# Patient Record
Sex: Female | Born: 1955
Health system: Southern US, Community
[De-identification: ages and names within clinical notes are randomized; demographics above are authoritative.]

## PROBLEM LIST (undated history)

## (undated) DIAGNOSIS — N189 Chronic kidney disease, unspecified: Secondary | ICD-10-CM

## (undated) DIAGNOSIS — E785 Hyperlipidemia, unspecified: Secondary | ICD-10-CM

## (undated) DIAGNOSIS — J40 Bronchitis, not specified as acute or chronic: Secondary | ICD-10-CM

## (undated) DIAGNOSIS — C801 Malignant (primary) neoplasm, unspecified: Secondary | ICD-10-CM

## (undated) DIAGNOSIS — I517 Cardiomegaly: Secondary | ICD-10-CM

## (undated) DIAGNOSIS — I10 Essential (primary) hypertension: Secondary | ICD-10-CM

## (undated) DIAGNOSIS — I509 Heart failure, unspecified: Secondary | ICD-10-CM

## (undated) HISTORY — DX: Cardiomegaly: I51.7

## (undated) HISTORY — DX: Chronic kidney disease, unspecified: N18.9

## (undated) HISTORY — PX: HERNIA REPAIR: SHX51

## (undated) HISTORY — PX: TOTAL KNEE ARTHROPLASTY: SHX125

## (undated) HISTORY — PX: TUBAL LIGATION: SHX77

## (undated) HISTORY — DX: Malignant (primary) neoplasm, unspecified: C80.1

## (undated) HISTORY — PX: OTHER SURGICAL HISTORY: SHX169

## (undated) HISTORY — DX: Bronchitis, not specified as acute or chronic: J40

## (undated) HISTORY — DX: Heart failure, unspecified: I50.9

## (undated) HISTORY — DX: Essential (primary) hypertension: I10

## (undated) HISTORY — DX: Hyperlipidemia, unspecified: E78.5

---

## 2004-06-30 ENCOUNTER — Other Ambulatory Visit: Payer: Self-pay

## 2004-08-25 ENCOUNTER — Ambulatory Visit: Payer: Self-pay | Admitting: Internal Medicine

## 2004-09-07 ENCOUNTER — Other Ambulatory Visit: Payer: Self-pay

## 2004-09-14 ENCOUNTER — Ambulatory Visit: Payer: Self-pay | Admitting: Orthopaedic Surgery

## 2004-09-14 ENCOUNTER — Ambulatory Visit: Payer: Self-pay | Admitting: Internal Medicine

## 2004-10-14 ENCOUNTER — Ambulatory Visit: Payer: Self-pay | Admitting: Internal Medicine

## 2004-10-19 ENCOUNTER — Ambulatory Visit: Payer: Self-pay | Admitting: Unknown Physician Specialty

## 2004-11-14 ENCOUNTER — Ambulatory Visit: Payer: Self-pay | Admitting: Internal Medicine

## 2005-08-24 ENCOUNTER — Emergency Department: Payer: Self-pay | Admitting: Emergency Medicine

## 2005-08-24 ENCOUNTER — Other Ambulatory Visit: Payer: Self-pay

## 2006-02-23 ENCOUNTER — Ambulatory Visit: Payer: Self-pay | Admitting: Internal Medicine

## 2006-03-09 ENCOUNTER — Ambulatory Visit: Payer: Self-pay | Admitting: Unknown Physician Specialty

## 2006-03-15 ENCOUNTER — Emergency Department: Payer: Self-pay | Admitting: Emergency Medicine

## 2006-03-29 ENCOUNTER — Ambulatory Visit: Payer: Self-pay | Admitting: Unknown Physician Specialty

## 2006-05-09 ENCOUNTER — Ambulatory Visit: Payer: Self-pay | Admitting: Gastroenterology

## 2006-07-27 ENCOUNTER — Ambulatory Visit: Payer: Self-pay | Admitting: Pain Medicine

## 2006-08-02 ENCOUNTER — Ambulatory Visit: Payer: Self-pay | Admitting: Pain Medicine

## 2006-08-10 ENCOUNTER — Emergency Department: Payer: Self-pay | Admitting: Internal Medicine

## 2006-09-05 ENCOUNTER — Ambulatory Visit: Payer: Self-pay | Admitting: Pain Medicine

## 2006-11-02 ENCOUNTER — Ambulatory Visit: Payer: Self-pay | Admitting: Pain Medicine

## 2006-11-22 ENCOUNTER — Ambulatory Visit: Payer: Self-pay | Admitting: Pain Medicine

## 2007-05-01 ENCOUNTER — Other Ambulatory Visit: Payer: Self-pay

## 2007-05-01 ENCOUNTER — Ambulatory Visit: Payer: Self-pay | Admitting: Orthopaedic Surgery

## 2007-05-30 ENCOUNTER — Ambulatory Visit: Payer: Self-pay | Admitting: Unknown Physician Specialty

## 2007-06-18 ENCOUNTER — Ambulatory Visit: Payer: Self-pay | Admitting: Orthopaedic Surgery

## 2007-07-10 ENCOUNTER — Inpatient Hospital Stay: Payer: Self-pay | Admitting: Orthopaedic Surgery

## 2007-09-02 IMAGING — CT CT ABD-PELV W/ CM
1 of 2 series · 15 of 32 positions shown, 19 images · non-contrast
Comparison: none

REASON FOR EXAM: LLQ pain  and abd pain
COMMENTS:

[Series 2: abdomen · axial · 0.96mm/px · z∈[-324,+76]mm · 15 of 56 slices shown, 19 images]
[im 3/56  soft-tissue]
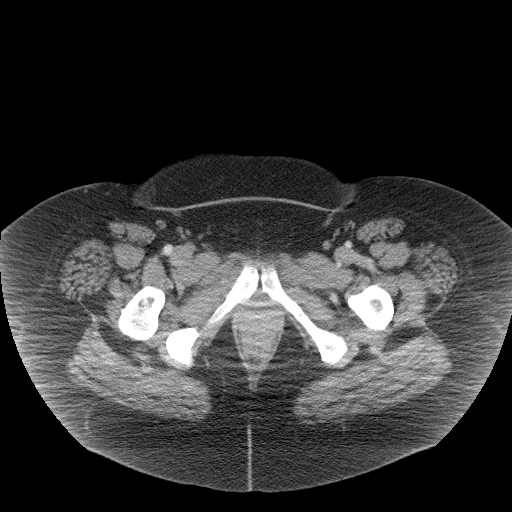
[im 3/56  bone]
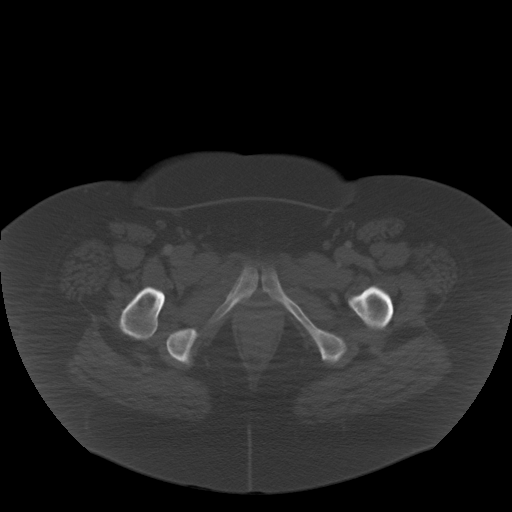
[im 7/56  soft-tissue]
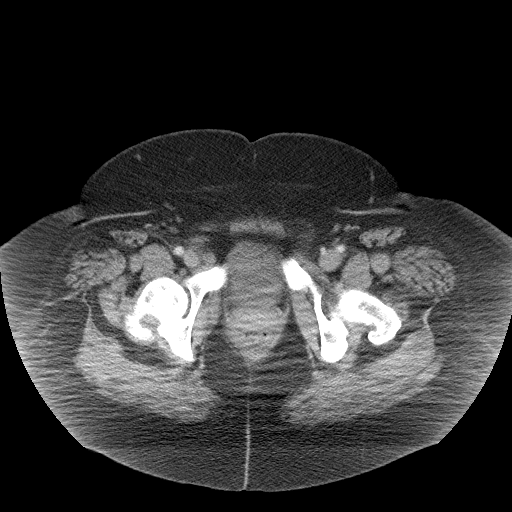
[im 12/56  soft-tissue]
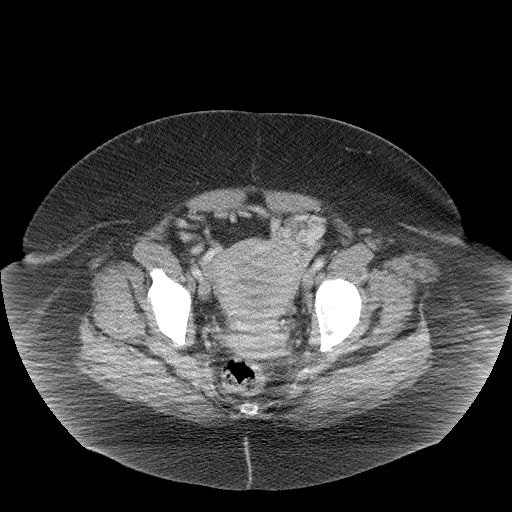
[im 17/56  soft-tissue]
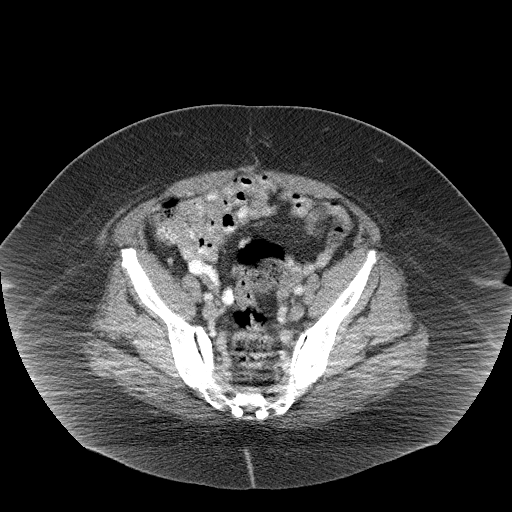
[im 19/56  soft-tissue]
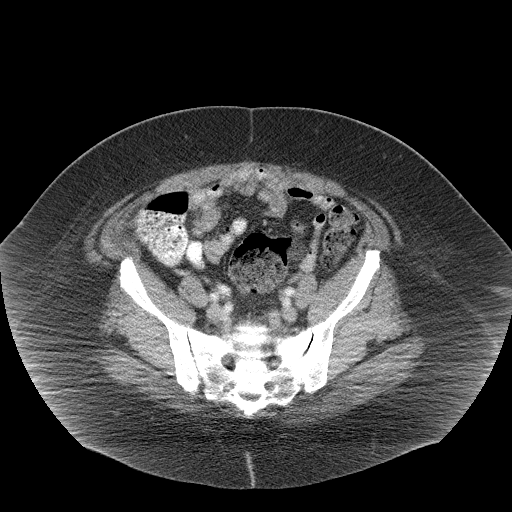
[im 23/56  soft-tissue]
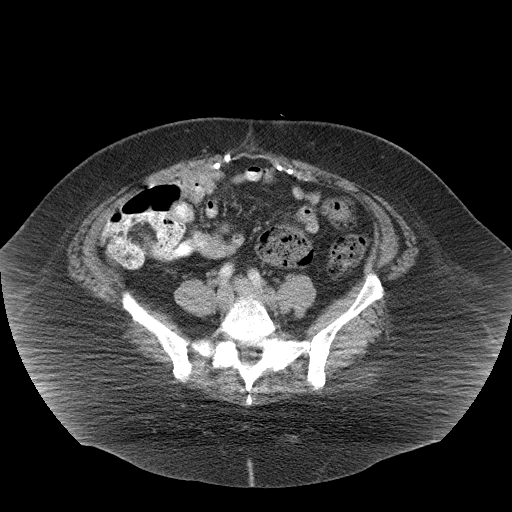
[im 28/56  soft-tissue]
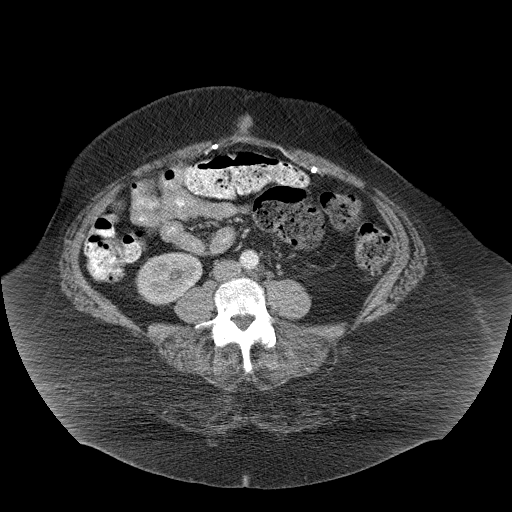
[im 33/56  soft-tissue]
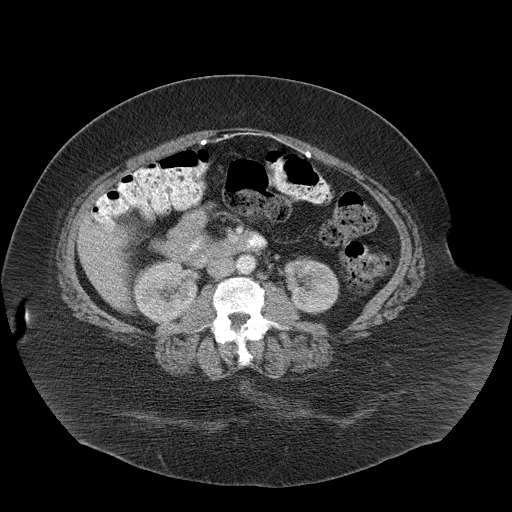
[im 37/56  soft-tissue]
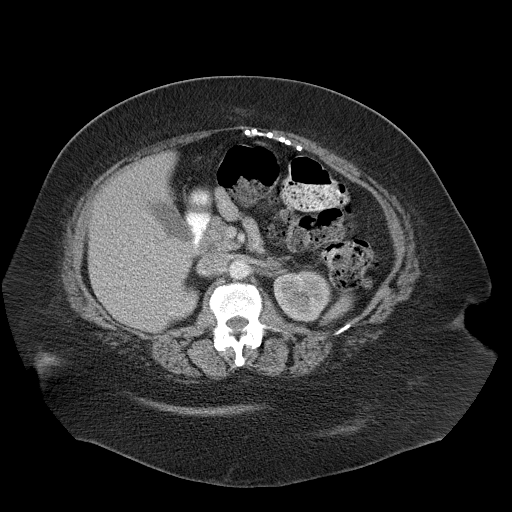
[im 37/56  bone]
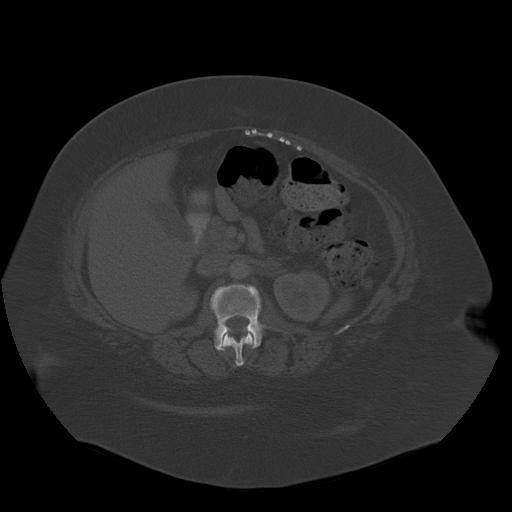
[im 39/56  soft-tissue]
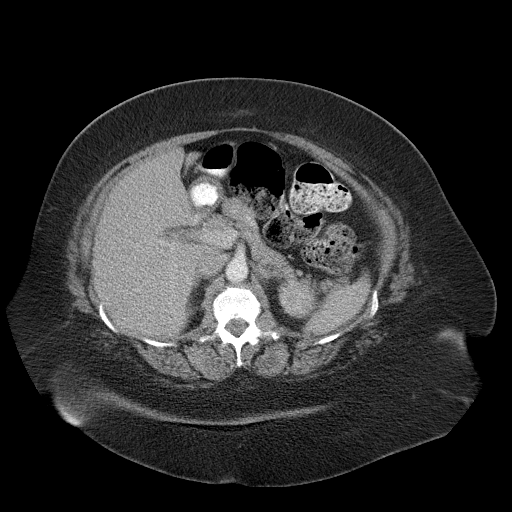
[im 44/56  soft-tissue]
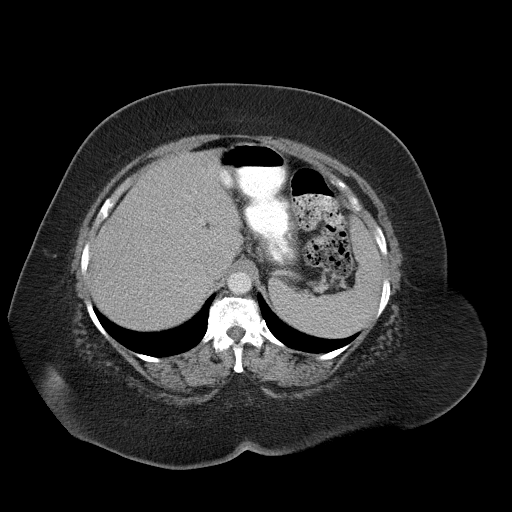
[im 46/56  lung]
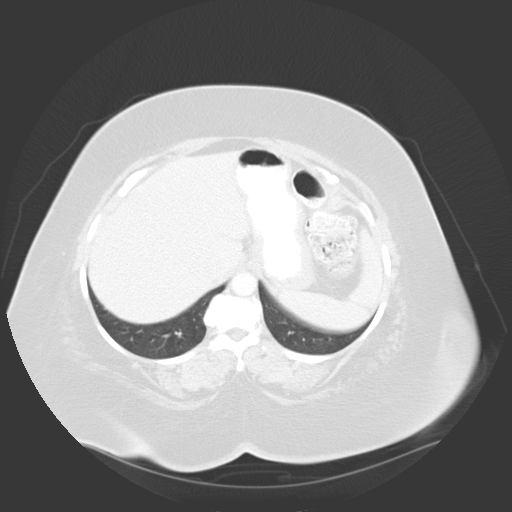
[im 49/56  soft-tissue]
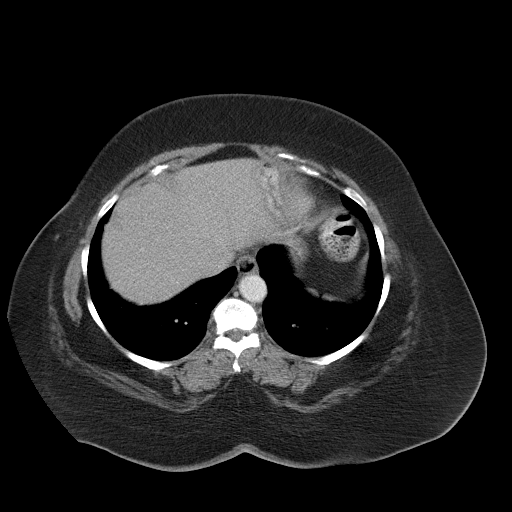
[im 49/56  lung]
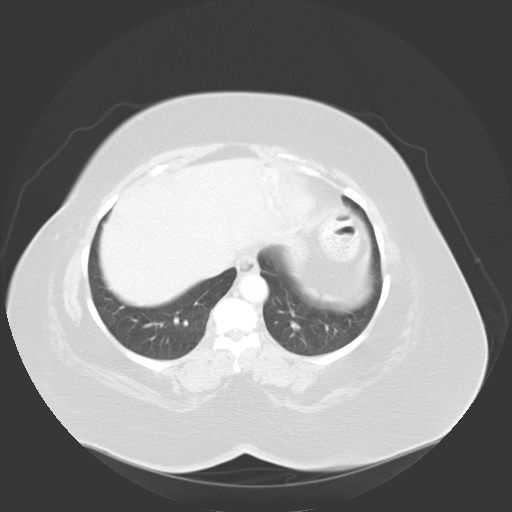
[im 51/56  lung]
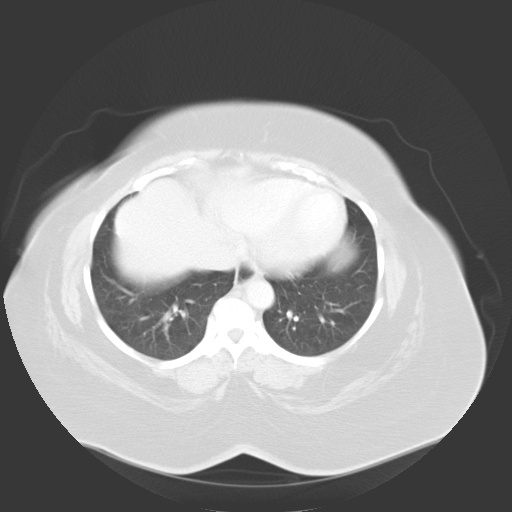
[im 53/56  soft-tissue]
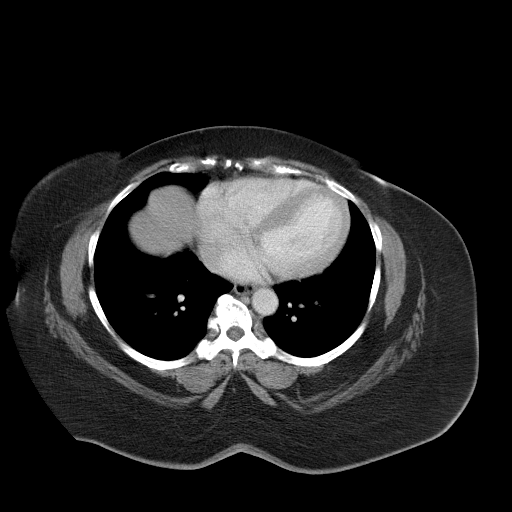
[im 53/56  lung]
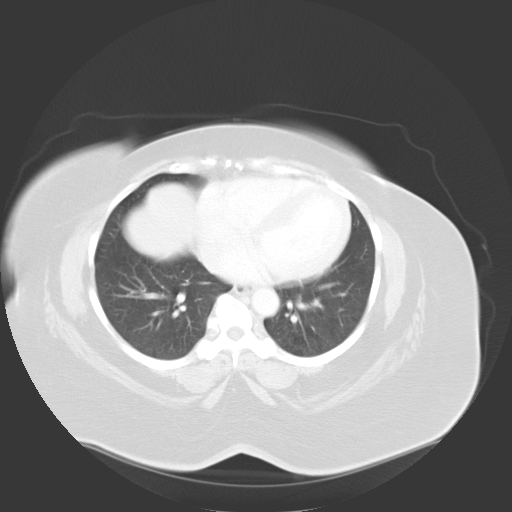

[15 of 32 positions shown; findings below may reference images not displayed]

PROCEDURE:     CT  - CT ABDOMEN / PELVIS  W  - March 09, 2006  [DATE]

RESULT:     IV and oral contrast enhanced CT scan of the abdomen and pelvis
is performed. The study shows a moderately large amount of stool scattered
through the colon down to the rectum.  There is some significant artifact
especially over the pelvic region because of the patient's body habitus.
There appears to be some air in the vaginal cuff anterior to the rectum.
There is significant artifact through the pelvic region. No definite
adenopathy is seen. The uterus is present in the midline in an anteverted
fashion.  No abnormal bowel distention is seen. There is no free air or
definite free fluid. Trace amounts of free fluid could not be completely
excluded given the artifact present.  No definite inflammatory stranding is
seen. There is an old ventral hernia repair.  The pancreas, liver, adrenal
glands and spleen appear to be normal. The gallbladder is present and no
radiopaque stones are evident. The aorta shows some minimal atherosclerotic
calcifications.
IMPRESSION: 1)No significant abnormality demonstrated.

## 2008-08-10 ENCOUNTER — Emergency Department: Payer: Self-pay | Admitting: Emergency Medicine

## 2008-10-23 ENCOUNTER — Ambulatory Visit: Payer: Self-pay | Admitting: Unknown Physician Specialty

## 2009-01-29 ENCOUNTER — Ambulatory Visit: Payer: Self-pay | Admitting: Unknown Physician Specialty

## 2009-07-28 ENCOUNTER — Ambulatory Visit: Payer: Self-pay | Admitting: Gastroenterology

## 2010-07-06 ENCOUNTER — Ambulatory Visit: Payer: Self-pay | Admitting: Unknown Physician Specialty

## 2013-01-09 ENCOUNTER — Emergency Department: Payer: Self-pay | Admitting: Emergency Medicine

## 2013-01-09 LAB — COMPREHENSIVE METABOLIC PANEL
Albumin: 3.3 g/dL — ABNORMAL LOW (ref 3.4–5.0)
Alkaline Phosphatase: 85 U/L (ref 50–136)
Anion Gap: 9 (ref 7–16)
BUN: 27 mg/dL — ABNORMAL HIGH (ref 7–18)
Bilirubin,Total: 0.2 mg/dL (ref 0.2–1.0)
Chloride: 104 mmol/L (ref 98–107)
Co2: 24 mmol/L (ref 21–32)
Creatinine: 1.25 mg/dL (ref 0.60–1.30)
EGFR (African American): 55 — ABNORMAL LOW
EGFR (Non-African Amer.): 47 — ABNORMAL LOW
Glucose: 104 mg/dL — ABNORMAL HIGH (ref 65–99)
Osmolality: 279 (ref 275–301)
Potassium: 3.9 mmol/L (ref 3.5–5.1)
SGOT(AST): 13 U/L — ABNORMAL LOW (ref 15–37)
Sodium: 137 mmol/L (ref 136–145)
Total Protein: 7.6 g/dL (ref 6.4–8.2)

## 2013-01-09 LAB — CK TOTAL AND CKMB (NOT AT ARMC): CK, Total: 99 U/L (ref 21–215)

## 2013-01-09 LAB — PRO B NATRIURETIC PEPTIDE: B-Type Natriuretic Peptide: 509 pg/mL — ABNORMAL HIGH (ref 0–125)

## 2013-01-09 LAB — CBC
MCH: 21.2 pg — ABNORMAL LOW (ref 26.0–34.0)
WBC: 6 10*3/uL (ref 3.6–11.0)

## 2013-01-09 LAB — TROPONIN I: Troponin-I: <0.02 ng/mL

## 2013-04-23 ENCOUNTER — Emergency Department: Payer: Self-pay | Admitting: Internal Medicine

## 2013-04-23 LAB — CBC
HGB: 10.7 g/dL — ABNORMAL LOW (ref 12.0–16.0)
MCH: 21.3 pg — ABNORMAL LOW (ref 26.0–34.0)
Platelet: 154 10*3/uL (ref 150–440)
RBC: 5.03 10*6/uL (ref 3.80–5.20)
RDW: 17.1 % — ABNORMAL HIGH (ref 11.5–14.5)
WBC: 5.5 10*3/uL (ref 3.6–11.0)

## 2013-04-23 LAB — CK TOTAL AND CKMB (NOT AT ARMC)
CK, Total: 105 U/L (ref 21–215)
CK-MB: 1.2 ng/mL (ref 0.5–3.6)

## 2013-04-23 LAB — COMPREHENSIVE METABOLIC PANEL
Alkaline Phosphatase: 92 U/L (ref 50–136)
Anion Gap: 5 — ABNORMAL LOW (ref 7–16)
Calcium, Total: 8.6 mg/dL (ref 8.5–10.1)
Co2: 28 mmol/L (ref 21–32)
Creatinine: 1 mg/dL (ref 0.60–1.30)
Potassium: 4.1 mmol/L (ref 3.5–5.1)
SGPT (ALT): 14 U/L (ref 12–78)
Sodium: 140 mmol/L (ref 136–145)
Total Protein: 7.5 g/dL (ref 6.4–8.2)

## 2013-04-23 LAB — PRO B NATRIURETIC PEPTIDE: B-Type Natriuretic Peptide: 2071 pg/mL — ABNORMAL HIGH (ref 0–125)

## 2013-04-23 LAB — TROPONIN I: Troponin-I: <0.02 ng/mL

## 2013-05-03 ENCOUNTER — Inpatient Hospital Stay: Payer: Self-pay | Admitting: Student

## 2013-05-03 LAB — CK TOTAL AND CKMB (NOT AT ARMC)
CK, Total: 122 U/L (ref 21–215)
CK, Total: 106 U/L (ref 21–215)
CK, Total: 117 U/L (ref 21–215)
CK-MB: 0.9 ng/mL (ref 0.5–3.6)
CK-MB: 0.7 ng/mL (ref 0.5–3.6)
CK-MB: 0.9 ng/mL (ref 0.5–3.6)

## 2013-05-03 LAB — TROPONIN I
Troponin-I: <0.02 ng/mL
Troponin-I: <0.02 ng/mL

## 2013-05-03 LAB — CBC
HCT: 36.4 % (ref 35.0–47.0)
HGB: 11.6 g/dL — ABNORMAL LOW (ref 12.0–16.0)
MCHC: 32 g/dL (ref 32.0–36.0)
MCV: 69 fL — ABNORMAL LOW (ref 80–100)
Platelet: 193 10*3/uL (ref 150–440)
RDW: 16.7 % — ABNORMAL HIGH (ref 11.5–14.5)
WBC: 6.3 10*3/uL (ref 3.6–11.0)

## 2013-05-03 LAB — URINALYSIS, COMPLETE
Bilirubin,UR: NEGATIVE
Blood: NEGATIVE
Ketone: NEGATIVE
Nitrite: NEGATIVE
Ph: 6 (ref 4.5–8.0)
Protein: NEGATIVE
Specific Gravity: 1.014 (ref 1.003–1.030)
WBC UR: 2 /HPF (ref 0–5)

## 2013-05-03 LAB — SODIUM, URINE, RANDOM: Sodium, Urine Random: 102 mmol/L (ref 20–110)

## 2013-05-03 LAB — BASIC METABOLIC PANEL
Chloride: 107 mmol/L (ref 98–107)
EGFR (Non-African Amer.): 21 — ABNORMAL LOW
Osmolality: 289 (ref 275–301)
Potassium: 3.2 mmol/L — ABNORMAL LOW (ref 3.5–5.1)
Sodium: 141 mmol/L (ref 136–145)

## 2013-05-03 LAB — PRO B NATRIURETIC PEPTIDE: B-Type Natriuretic Peptide: 292 pg/mL — ABNORMAL HIGH (ref 0–125)

## 2013-05-04 LAB — CBC WITH DIFFERENTIAL/PLATELET
Basophil %: 0.9 %
Eosinophil #: 0.2 10*3/uL (ref 0.0–0.7)
HGB: 11.3 g/dL — ABNORMAL LOW (ref 12.0–16.0)
Lymphocyte #: 1.9 10*3/uL (ref 1.0–3.6)
Lymphocyte %: 35.2 %
MCH: 21.8 pg — ABNORMAL LOW (ref 26.0–34.0)
MCHC: 32 g/dL (ref 32.0–36.0)
MCV: 68 fL — ABNORMAL LOW (ref 80–100)
Monocyte #: 0.4 x10 3/mm (ref 0.2–0.9)
Monocyte %: 7.3 %
Neutrophil #: 2.7 10*3/uL (ref 1.4–6.5)
Neutrophil %: 51.9 %
Platelet: 184 10*3/uL (ref 150–440)
RBC: 5.18 10*6/uL (ref 3.80–5.20)
RDW: 16.6 % — ABNORMAL HIGH (ref 11.5–14.5)
WBC: 5.3 10*3/uL (ref 3.6–11.0)

## 2013-05-04 LAB — BASIC METABOLIC PANEL
Calcium, Total: 8.7 mg/dL (ref 8.5–10.1)
Chloride: 106 mmol/L (ref 98–107)
Co2: 27 mmol/L (ref 21–32)
Creatinine: 1.57 mg/dL — ABNORMAL HIGH (ref 0.60–1.30)
EGFR (African American): 41 — ABNORMAL LOW
EGFR (Non-African Amer.): 36 — ABNORMAL LOW
Glucose: 95 mg/dL (ref 65–99)
Potassium: 3.7 mmol/L (ref 3.5–5.1)
Sodium: 139 mmol/L (ref 136–145)

## 2013-05-04 LAB — URINE CULTURE

## 2013-05-04 LAB — IRON AND TIBC
Iron Bind.Cap.(Total): 268 ug/dL (ref 250–450)
Iron Saturation: 24 %
Iron: 65 ug/dL (ref 50–170)
Unbound Iron-Bind.Cap.: 203 ug/dL

## 2013-05-04 LAB — FERRITIN: Ferritin (ARMC): 51 ng/mL (ref 8–388)

## 2013-05-05 LAB — BASIC METABOLIC PANEL
BUN: 20 mg/dL — ABNORMAL HIGH (ref 7–18)
Co2: 27 mmol/L (ref 21–32)
EGFR (African American): 48 — ABNORMAL LOW
EGFR (Non-African Amer.): 41 — ABNORMAL LOW
Osmolality: 281 (ref 275–301)
Potassium: 3.9 mmol/L (ref 3.5–5.1)
Sodium: 140 mmol/L (ref 136–145)

## 2013-07-09 ENCOUNTER — Other Ambulatory Visit: Payer: Self-pay

## 2014-06-20 ENCOUNTER — Emergency Department: Payer: Self-pay | Admitting: Emergency Medicine

## 2014-06-20 LAB — COMPREHENSIVE METABOLIC PANEL
ANION GAP: 8 (ref 7–16)
AST: 8 U/L — AB (ref 15–37)
Albumin: 3.5 g/dL (ref 3.4–5.0)
Alkaline Phosphatase: 74 U/L
BUN: 34 mg/dL — AB (ref 7–18)
Bilirubin,Total: 0.4 mg/dL (ref 0.2–1.0)
Calcium, Total: 8.9 mg/dL (ref 8.5–10.1)
Chloride: 106 mmol/L (ref 98–107)
Co2: 28 mmol/L (ref 21–32)
Creatinine: 2.05 mg/dL — ABNORMAL HIGH (ref 0.60–1.30)
GLUCOSE: 98 mg/dL (ref 65–99)
Osmolality: 291 (ref 275–301)
Potassium: 3.9 mmol/L (ref 3.5–5.1)
SGPT (ALT): 13 U/L — ABNORMAL LOW
SODIUM: 142 mmol/L (ref 136–145)
Total Protein: 7.7 g/dL (ref 6.4–8.2)

## 2014-06-20 LAB — CBC WITH DIFFERENTIAL/PLATELET
BASOS ABS: 0 10*3/uL (ref 0.0–0.1)
BASOS PCT: 0.7 %
Eosinophil #: 0.3 10*3/uL (ref 0.0–0.7)
Eosinophil %: 5 %
HCT: 40.9 % (ref 35.0–47.0)
HGB: 12.5 g/dL (ref 12.0–16.0)
Lymphocyte #: 1.9 10*3/uL (ref 1.0–3.6)
Lymphocyte %: 35.2 %
MCH: 21.7 pg — AB (ref 26.0–34.0)
MCHC: 30.5 g/dL — ABNORMAL LOW (ref 32.0–36.0)
MCV: 71 fL — AB (ref 80–100)
MONO ABS: 0.4 x10 3/mm (ref 0.2–0.9)
Monocyte %: 7.3 %
NEUTROS PCT: 51.8 %
Neutrophil #: 2.8 10*3/uL (ref 1.4–6.5)
Platelet: 168 10*3/uL (ref 150–440)
RBC: 5.75 10*6/uL — AB (ref 3.80–5.20)
RDW: 17.4 % — ABNORMAL HIGH (ref 11.5–14.5)
WBC: 5.4 10*3/uL (ref 3.6–11.0)

## 2014-06-20 LAB — URINALYSIS, COMPLETE
BLOOD: NEGATIVE
Bilirubin,UR: NEGATIVE
Glucose,UR: NEGATIVE mg/dL (ref 0–75)
KETONE: NEGATIVE
LEUKOCYTE ESTERASE: NEGATIVE
Nitrite: NEGATIVE
Ph: 6 (ref 4.5–8.0)
Protein: 30
Specific Gravity: 1.017 (ref 1.003–1.030)
WBC UR: 8 /HPF (ref 0–5)

## 2014-06-20 LAB — LIPASE, BLOOD: Lipase: 100 U/L (ref 73–393)

## 2014-06-20 LAB — TROPONIN I: Troponin-I: <0.02 ng/mL

## 2014-07-10 ENCOUNTER — Other Ambulatory Visit (HOSPITAL_COMMUNITY): Payer: Self-pay | Admitting: *Deleted

## 2014-07-10 DIAGNOSIS — I517 Cardiomegaly: Secondary | ICD-10-CM

## 2014-07-11 ENCOUNTER — Other Ambulatory Visit: Payer: Self-pay

## 2014-07-11 ENCOUNTER — Other Ambulatory Visit (INDEPENDENT_AMBULATORY_CARE_PROVIDER_SITE_OTHER): Payer: BC Managed Care – PPO

## 2014-07-11 DIAGNOSIS — I517 Cardiomegaly: Secondary | ICD-10-CM

## 2014-07-11 DIAGNOSIS — R0609 Other forms of dyspnea: Secondary | ICD-10-CM

## 2014-07-11 DIAGNOSIS — R0989 Other specified symptoms and signs involving the circulatory and respiratory systems: Secondary | ICD-10-CM

## 2014-09-19 ENCOUNTER — Encounter: Payer: Self-pay | Admitting: *Deleted

## 2014-09-22 ENCOUNTER — Ambulatory Visit (INDEPENDENT_AMBULATORY_CARE_PROVIDER_SITE_OTHER): Payer: BC Managed Care – PPO | Admitting: Cardiovascular Disease

## 2014-09-22 ENCOUNTER — Encounter: Payer: Self-pay | Admitting: Cardiovascular Disease

## 2014-09-22 VITALS — BP 122/90 | HR 63 | Ht 63.0 in | Wt 335.2 lb

## 2014-09-22 DIAGNOSIS — R0789 Other chest pain: Secondary | ICD-10-CM

## 2014-09-22 DIAGNOSIS — R0602 Shortness of breath: Secondary | ICD-10-CM

## 2014-09-22 DIAGNOSIS — I517 Cardiomegaly: Secondary | ICD-10-CM

## 2014-09-22 DIAGNOSIS — I10 Essential (primary) hypertension: Secondary | ICD-10-CM

## 2014-09-22 NOTE — Assessment & Plan Note (Signed)
Blood pressure is well controlled on current medications. 

## 2014-09-22 NOTE — Progress Notes (Signed)
Primary care physician: Dr. Rosario Jacks  HPI  This is a pleasant 58 year old female who was referred for evaluation of abdominal pain and possible cardiac etiology. She has no previous cardiac history. She has known history of hypertension, hyperlipidemia and morbid obesity. There is no history of diabetes. She has been experiencing abdominal pain mostly in the mid and lower abdomen with associated nausea. This can happen at rest and with physical activities. She has chronic exertional dyspnea and overall poor functional capacity. She had an echocardiogram done in our office which showed an ejection fraction of 50-55% with possible inferior wall hypokinesis. She is not a smoker and has no family history of coronary artery disease.  No Known Allergies   Current Outpatient Prescriptions on File Prior to Visit  Medication Sig Dispense Refill  . chlorthalidone (HYGROTON) 25 MG tablet Take 12.5 mg by mouth daily.    Marland Kitchen dexlansoprazole (DEXILANT) 60 MG capsule Take 60 mg by mouth daily.    . ergocalciferol (VITAMIN D2) 50000 UNITS capsule Take 50,000 Units by mouth once a week.    . losartan (COZAAR) 100 MG tablet Take 100 mg by mouth daily.    . metoprolol succinate (TOPROL-XL) 50 MG 24 hr tablet Take 50 mg by mouth daily. Take with or immediately following a meal.     No current facility-administered medications on file prior to visit.     Past Medical History  Diagnosis Date  . Hypertension   . Hyperlipidemia   . Chronic kidney disease   . CHF (congestive heart failure)   . Enlarged heart   . Bronchitis      Past Surgical History  Procedure Laterality Date  . Total knee arthroplasty    . Hernia repair    . Arm surgery       Family History  Problem Relation Age of Onset  . Cancer Mother   . Hypertension Father   . Heart attack Father   . Stroke Father      History   Social History  . Marital Status: Legally Separated    Spouse Name: N/A    Number of Children: N/A  .  Years of Education: N/A   Occupational History  . Not on file.   Social History Main Topics  . Smoking status: Never Smoker   . Smokeless tobacco: Not on file  . Alcohol Use: No  . Drug Use: No  . Sexual Activity: Not on file   Other Topics Concern  . Not on file   Social History Narrative     ROS A 10 point review of system was performed. It is negative other than that mentioned in the history of present illness.   PHYSICAL EXAM   BP 122/90 mmHg  Pulse 63  Ht 5\' 3"  (1.6 m)  Wt 335 lb 4 oz (152.068 kg)  BMI 59.40 kg/m2 Constitutional: She is oriented to person, place, and time. She appears well-developed and well-nourished. No distress.  HENT: No nasal discharge.  Head: Normocephalic and atraumatic.  Eyes: Pupils are equal and round. No discharge.  Neck: Normal range of motion. Neck supple. No JVD present. No thyromegaly present.  Cardiovascular: Normal rate, regular rhythm, normal heart sounds. Exam reveals no gallop and no friction rub. No murmur heard.  Pulmonary/Chest: Effort normal and breath sounds normal. No stridor. No respiratory distress. She has no wheezes. She has no rales. She exhibits no tenderness.  Abdominal: Soft. Bowel sounds are normal. She exhibits no distension. There is no tenderness. There  is no rebound and no guarding.  Musculoskeletal: Normal range of motion. She exhibits no edema and no tenderness.  Neurological: She is alert and oriented to person, place, and time. Coordination normal.  Skin: Skin is warm and dry. No rash noted. She is not diaphoretic. No erythema. No pallor.  Psychiatric: She has a normal mood and affect. Her behavior is normal. Judgment and thought content normal.     [id number]  Rhythm  - occasional ectopic ventricular beat    WITHIN NORMAL LIMITS   ASSESSMENT AND PLAN

## 2014-09-22 NOTE — Patient Instructions (Addendum)
[place]  Your caregiver has ordered a Stress Test with nuclear imaging. The purpose of this test is to evaluate the blood supply to your heart muscle. This procedure is referred to as a "Non-Invasive Stress Test." This is because other than having an IV started in your vein, nothing is inserted or "invades" your body. Cardiac stress tests are done to find areas of poor blood flow to the heart by determining the extent of coronary artery disease (CAD). Some patients exercise on a treadmill, which naturally increases the blood flow to your heart, while others who are  unable to walk on a treadmill due to physical limitations have a pharmacologic/chemical stress agent called Lexiscan . This medicine will mimic walking on a treadmill by temporarily increasing your coronary blood flow.   Please note: these test may take anywhere between 2-4 hours to complete  PLEASE REPORT TO [facility] AT THE FIRST DESK WILL DIRECT YOU WHERE TO GO  Date of Procedure:_____________________________________  Arrival Time for Procedure:______________________________    PLEASE NOTIFY THE OFFICE AT LEAST 24 HOURS IN ADVANCE IF YOU ARE UNABLE TO KEEP YOUR APPOINTMENT.  [phone] AND  PLEASE NOTIFY NUCLEAR MEDICINE AT [facility] AT LEAST 24 HOURS IN ADVANCE IF YOU ARE UNABLE TO KEEP YOUR APPOINTMENT. [phone]  How to prepare for your Myoview test:  1. Do not eat or drink after midnight 2. No caffeine for 24 hours prior to test 3. No smoking 24 hours prior to test. 4. Your medication may be taken with water.  If your doctor stopped a medication because of this test, do not take that medication. 5. Ladies, please do not wear dresses.  Skirts or pants are appropriate. Please wear a short sleeve shirt. 6. No perfume, cologne or lotion. 7. Wear comfortable walking shoes. No heels!  Your physician recommends that you schedule a follow-up appointment in:  As needed

## 2014-09-22 NOTE — Assessment & Plan Note (Signed)
I suspect that the abdominal pain is likely GI in nature. However, she reports an exertional component and has multiple risk factors for coronary artery disease. Echocardiogram also showed possible inferior wall hypokinesis. Thus, I recommend evaluation with a pharmacologic nuclear stress test. This will be done as a 2 day protocol. If cardiac workup is negative, I agree with GI evaluation.

## 2014-09-23 ENCOUNTER — Telehealth: Payer: Self-pay | Admitting: *Deleted

## 2014-09-23 NOTE — Telephone Encounter (Signed)
LVM to inform patient her Anne Jennings has been scheduled for 09/30/14 and 10/01/14 at 0945 am both days

## 2014-09-23 NOTE — Telephone Encounter (Signed)
Patient verbalized understanding  

## 2014-09-29 ENCOUNTER — Telehealth: Payer: Self-pay | Admitting: Cardiovascular Disease

## 2014-09-29 NOTE — Telephone Encounter (Signed)
Pt called trying to reschedule her myocardial that is set for tmrw,she can not make it because of her job. She would like someone to call her back soon

## 2014-09-30 ENCOUNTER — Ambulatory Visit: Payer: Self-pay | Admitting: Cardiovascular Disease

## 2014-09-30 DIAGNOSIS — R079 Chest pain, unspecified: Secondary | ICD-10-CM

## 2014-09-30 NOTE — Telephone Encounter (Signed)
Patient states she can make day one of her Lexi on 11/17 but not day 2 on 11/18 Discussed with [facility] on 11/16. They stated isotope has been ordered  Ryan to read part one today and determine if day 2 is needed

## 2014-09-30 NOTE — Telephone Encounter (Signed)
Day 2 (resting images can be done at later date, if needed at all).  We will contact patient with this info after the stress images have been read this afternoon.

## 2014-09-30 NOTE — Telephone Encounter (Signed)
Awaiting results from [facility] 11/17

## 2014-10-07 NOTE — Telephone Encounter (Signed)
Stress images are not up in [facility]. Can you call nuc med and have them transmitted so MD can read? That way we can determine if resting images will be needed.

## 2014-10-14 NOTE — Telephone Encounter (Signed)
Abby from [facility] is checking to see why report is not in [facility]

## 2014-10-14 NOTE — Telephone Encounter (Signed)
Patient already completed resting day  Dr. Rockey Situ to read results today

## 2014-10-16 ENCOUNTER — Encounter: Payer: Self-pay | Admitting: Cardiovascular Disease

## 2014-10-16 NOTE — Telephone Encounter (Signed)
Results forwarded to Dr. Fletcher Anon

## 2014-10-17 IMAGING — CR DG CHEST 2V
1 series · 2 of 2 positions shown · non-contrast
Comparison: none

REASON FOR EXAM: cough
COMMENTS:

[Series 1: w chest pa · 0.14mm/px · 2 of 2 slices shown]
[im 1/2]
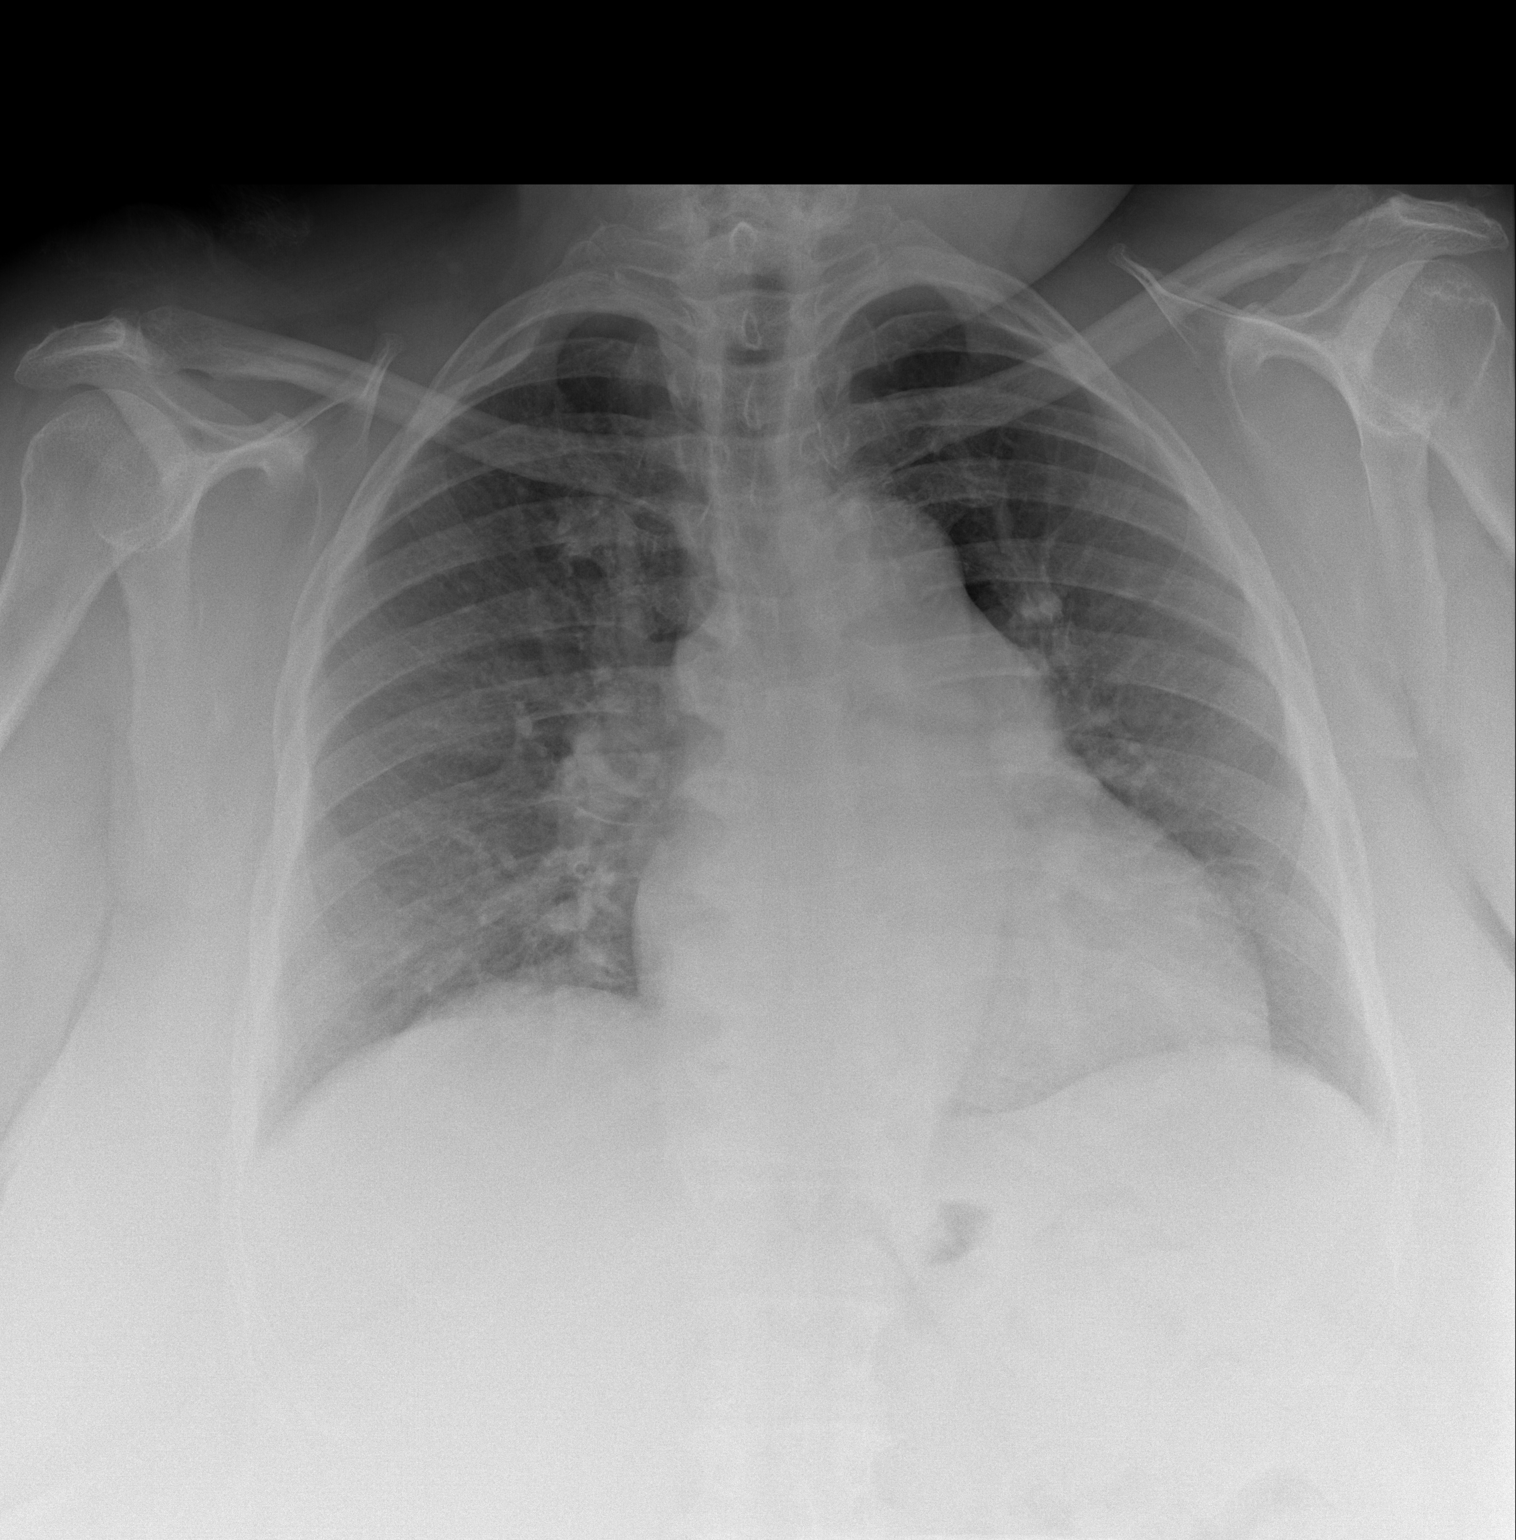
[im 2/2]
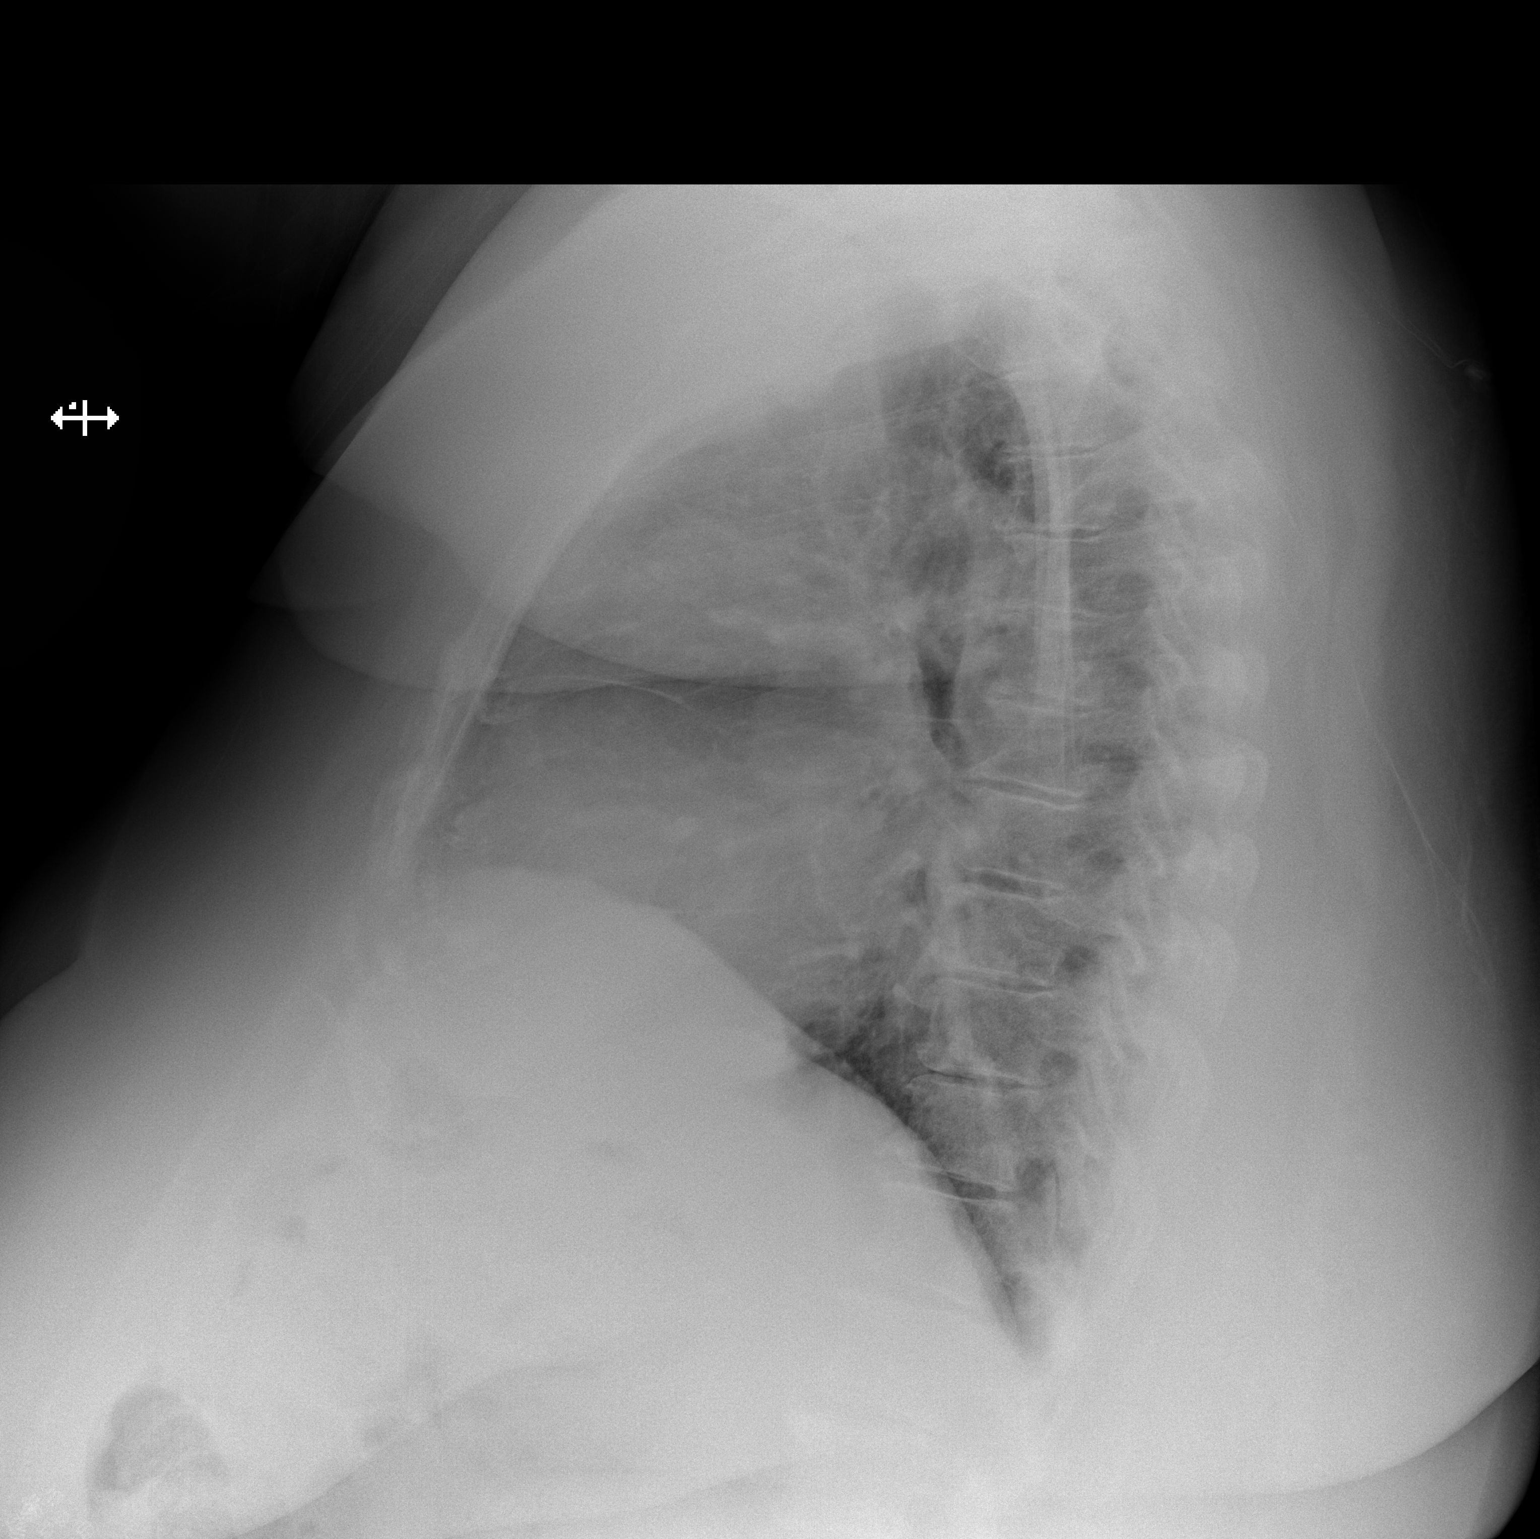

[2 of 2 positions shown; findings below may reference images not displayed]

PROCEDURE:     DXR - DXR CHEST PA (OR AP) AND LATERAL  - April 23, 2013  [DATE]

RESULT:     Comparison is made to the study January 09, 2013.

The lungs are adequately inflated. The cardiac silhouette is enlarged. The
central pulmonary vascularity is engorged. There is tortuosity of the
descending thoracic aorta. There is no pleural effusion. The bony thorax
exhibits no acute abnormality.
IMPRESSION: The findings suggest low-grade CHF superimposed upon
underlying COPD or reactive airway disease. There is no focal pneumonia.
Followup films following therapy are recommended to assure clearing.

[REDACTED]

## 2015-02-03 ENCOUNTER — Emergency Department: Payer: Self-pay | Admitting: Emergency Medicine

## 2015-03-06 NOTE — Discharge Summary (Signed)
PATIENT NAME:  Anne Jennings, Anne Jennings MR#:  [id number] DATE OF BIRTH:  Mar 18, 1956  DATE OF ADMISSION:  05/03/2013 DATE OF DISCHARGE:  05/05/2013  CONSULTANTS: Dr. Juleen China from nephrology.   PRIMARY CARE PHYSICIAN: She will be following with Dr. Rosario Jacks as an outpatient.   CHIEF COMPLAINT: Chest pain and shortness of breath.   DISCHARGE DIAGNOSES:  1.  Acute renal failure.  2.  Mild chronic systolic congestive heart failure.  3.  Chest pain, likely musculoskeletal.  4.  Hypertension.  5.  Hypercholesterolemia.  6.  History of anemia.  7.  History of diverticulosis.   DISCHARGE MEDICATIONS: Diltiazem 240 mg extended-release 1 tab daily, metoprolol 25 mg extended-release 1 tab daily and Hyzaar 25/100, 1 tab once a day.   DIET: Low sodium, low fat, low cholesterol.   ACTIVITY: As tolerated.   FOLLOWUP: Please follow with PCP and check a BMP within 1 to 2 weeks.   DISPOSITION: Home.   SIGNIFICANT LABS AND IMAGING: Initial BNP was 292. BUN 31, creatinine was 2.41. Creatinine on discharge was 1.32, BUN of 20. Initial potassium 3.2. Serum iron was 65, TIBC was 268, iron saturation was 24 and ferritin was 51. Troponin, CK-MB and CK total negative x3. Initial WBC 6.3, hemoglobin 11.6. Urine cultures grew mixed flora. UA not suggestive of infection. Urine eosinophils negative. Chest PA and lateral, showed no acute cardiopulmonary disease. Renal ultrasound showed normal appearing renal sonogram.  HISTORY OF PRESENT ILLNESS AND HOSPITAL COURSE: For full details of H and P, please see the dictation by Dr. Waldron Labs, but briefly, this is a pleasant 59 year old obese female with hypertension, who came in for shortness of breath and chest pain, which was atypical. She was admitted to the hospitalist service for further evaluation and management. Of note, the patient had recently came to the hospital and was diagnosed with acute bronchitis and discharged with azithromycin. She denied having any recent travel, leg  swelling, hemoptysis, leg pain or calf pain. She was noted to have acute renal failure obvious when comparing the creatinines. She was admitted to the hospitalist service. She was ruled out for acute coronary syndrome with negative troponins and CK-MB. An echocardiogram was obtained, which showed mild decrease in systolic function and the patient likely had chronic mild compensated systolic CHF. She did no appear to have pulmonary hypertension. She did not have any further chest pains. She was started on IV fluids and nephrology was consulted for renal failure. This was likely prerenal. Her Hyzaar was initially held. The shortness of breath resolved. The renal function improved with IV fluids. This was worked up with urine eosinophils, which was also negative. At this point, she will be discharged with outpatient followup   CODE STATUS: The patient is full code.   TOTAL TIME SPENT: 35 minutes.  ____________________________ Vivien Presto, MD sa:aw D: 05/07/2013 08:24:08 ET T: 05/07/2013 10:15:59 ET JOB#: [id number]  cc: Vivien Presto, MD, <Dictator> Isla Pence, MD Vivien Presto MD ELECTRONICALLY SIGNED 05/12/2013 22:16

## 2015-03-06 NOTE — H&P (Signed)
PATIENT NAME:  Anne Jennings, Anne Jennings MR#:  [id number] DATE OF BIRTH:  December 13, 1955  DATE OF ADMISSION:  05/03/2013  REFERRING PHYSICIAN: Latina Craver, MD  PRIMARY CARE PHYSICIAN: She did not have any PCP for the last 2 years, but she will start following with Dr. Rosario Jacks and has an appointment scheduled for next Friday.   CHIEF COMPLAINT: Chest pain and shortness of breath.   HISTORY OF PRESENT ILLNESS: This is a 59 year old female with significant past medical history of hypertension, morbid obesity, who presents with complaints of shortness of breath and chest pain. The patient reports she woke up from sleep having some chest pain, left-sided, reproducible by deep inspiration, As well, she had shortness of breath as well. The patient was recently in [facility] before 10 days, where she was diagnosed with acute bronchitis, where she was discharged on p.o. azithromycin. The patient denies any fever, chills, any sweating, any cough, any hemoptysis, any history of recent travel or flying, any leg swelling or any hemoptysis, any leg pain, calf pain or ankle or leg sweating. As well, denies any shortness of breath while sleeping supine, uses only 1 pillow at night. The patient's first set of troponin was negative. She did have some old ST changes when compared to the last EKG, without any new findings on her EKG. The patient's chest x-ray did not show any opacity or infiltrate, did show some mild cardiomegaly without any evidence of volume overload as well. The patient had basic blood work done which did show her creatinine to be elevated at 2.4, where she had her creatinine previously during the last visit to the ED on June 10th was within normal limits. The patient denies any dysuria, polyuria, any urinary problems or retention. Hospitalist service was requested to admit the patient for further management and workup for acute renal failure and chest pain.   PAST MEDICAL HISTORY: 1. Chest pain.  2.  Hypercholesterolemia.  3. Anemia.  4. Benign hypertension.  5. History of congestive heart failure as per history, unclear which type.  6. Diverticulosis.   PAST SURGICAL HISTORY:  1. Tubal ligation.  2. Right ulnar nerve decompression.  3. Ventral hernia repair in 2003, with I and D for an abdominal seroma.  4. Left total knee replacement in 2008.   HOME MEDICATIONS:  1. Cardizem extended release 240 mg oral daily.  2. Hyzaar 100/25 mg 1 tablet oral daily.  3. Toprol-XL 25 mg oral daily.   ALLERGIES: No known drug allergies.   FAMILY HISTORY: Mother had history of lung cancer.   SOCIAL HISTORY: She lives at home by herself. Works at [employer]. Denies any history of smoking or illicit drug use or alcohol use.   REVIEW OF SYSTEMS:  CONSTITUTIONAL: Denies fevers, chills, fatigue, weakness.  EYES: Denies blurry vision, double vision, pain, inflammation, glaucoma.  ENT: Denies tinnitus, ear pain, hearing loss, epistaxis or discharge.  RESPIRATORY: Denies any cough, wheezing, hemoptysis or COPD. Complains of some dyspnea.  CARDIOVASCULAR: Has complaint of atypical chest pain, nonradiating. Denies edema, arrhythmia, palpitations, syncope.  GASTROINTESTINAL: Denies nausea, vomiting, diarrhea, abdominal pain, melena, rectal bleed or jaundice.  GENITOURINARY: Denies dysuria, hematuria, renal colic.  ENDOCRINE: Denies polyuria, polydipsia, heat or cold intolerance.  HEMATOLOGY: Denies easy bruising or bleeding diathesis.  INTEGUMENT: Denies acne, rash or skin lesions.  MUSCULOSKELETAL: Denies any gout, cramps or redness.  NEUROLOGIC: Denies any CVA, TIA, seizures, headache, dementia or ataxia.  PSYCHIATRIC: Denies anxiety, insomnia, schizophrenia, nervousness or bipolar disorder.  PHYSICAL EXAMINATION:  VITAL SIGNS: Temperature 97.3, pulse 66, respiratory rate 20, blood pressure 157/69, saturating 98% on room air.  GENERAL: Morbidly obese female who looks comfortable in bed, in no  apparent distress.  HEENT: Head atraumatic, normocephalic. Pupils equal and reactive to light. Pink conjunctivae. Anicteric sclerae. Moist oral mucosa.  NECK: Supple. No thyromegaly. No JVD. No carotid bruits. Trachea is midline.  CHEST: Good air entry bilaterally. No wheezing, rales, rhonchi. No crackles. Has reproducible chest pain in the left chest wall, reproducible by palpation. The patient reports this resembles the chest pain which brought her to the ED today.  CARDIOVASCULAR: S1, S2 heard. No rubs, murmurs or gallops.  ABDOMEN: Obese, soft, nontender, nondistended. Bowel sounds present.  EXTREMITIES: No edema. No clubbing. No cyanosis. Dorsalis pedis pulse +2 bilaterally.  SKIN: Normal skin turgor. Warm and dry.  LYMPHATICS: No cervical or supraclavicular lymphadenopathy.  PSYCHIATRIC: Appropriate affect. Awake, alert x3. Intact judgment and insight. NEUROLOGIC: Cranial nerves grossly intact. Motor 5 out of 5 in all extremities. No focal motor or sensory deficits.   PERTINENT LABORATORY DATA: Glucose 114, BUN 31, creatinine 2.41, sodium 141, potassium 3.2, chloride 107, CO2 28. Troponin less than 0.02. White blood cells 6.3, hemoglobin 11.6, hematocrit 36.4, platelets 193.   IMAGING: Chest x-ray does not show any vascular congestion or any volume overload, with no evidence of congestive failure.   ASSESSMENT AND PLAN:  1. Acute renal failure. The patient is known to have normal creatinine before 10 days, during last ED visit. Currently, is in acute renal failure. The patient had a bladder scan done in the ED which is showing urine volume less than 170 mL, so it is unlikely urinary retention. Will hold her Hyzaar. Will start her on gentle hydration. Will check urinalysis. Will check urine culture. Will check urine sodium and eosinophils. The patient not noted to be volume depleted, so unlikely this is prerenal failure. Will check an ultrasound. Will consult nephrology as well. Will hold all  nephrotoxic medications and will monitor creatinine closely.  2. Chest pain, appears to be atypical musculoskeletal quality, but she will be given 324 of aspirin until her cardiac enzymes are cycled and cardiac origin of her chest pain is ruled out.  3. Shortness of breath. The patient is known to have history of congestive heart failure, but does not appear to be clinically in congestive heart failure, but will obtain 2-D echo and BNP for her. There is a possibility she might be having some pulmonary hypertension from morbid obesity. At this point, will await for her echo, but currently she is saturating good on room air.  4. Hypertension. Will continue her Cardizem and metoprolol. Will continue to hold Hyzaar.  5. Deep vein thrombosis prophylaxis. Subcutaneous heparin.   CODE STATUS: The patient is full code.   TOTAL TIME SPENT ON ADMISSION AND PATIENT CARE: 55 minutes.   ____________________________ Albertine Patricia, MD dse:OSi D: 05/03/2013 08:25:07 ET T: 05/03/2013 08:41:06 ET JOB#: [id number]  cc: Albertine Patricia, MD, <Dictator> Kwinton Maahs Graciela Husbands MD ELECTRONICALLY SIGNED 05/04/2013 3:32

## 2015-08-27 ENCOUNTER — Ambulatory Visit: Payer: BLUE CROSS/BLUE SHIELD

## 2015-08-27 ENCOUNTER — Encounter: Payer: Self-pay | Admitting: General Surgery

## 2015-08-27 ENCOUNTER — Ambulatory Visit (INDEPENDENT_AMBULATORY_CARE_PROVIDER_SITE_OTHER): Payer: BLUE CROSS/BLUE SHIELD | Admitting: General Surgery

## 2015-08-27 VITALS — BP 120/70 | HR 76 | Resp 14 | Ht 63.0 in | Wt 316.0 lb

## 2015-08-27 DIAGNOSIS — N6310 Unspecified lump in the right breast, unspecified quadrant: Secondary | ICD-10-CM

## 2015-08-27 DIAGNOSIS — N63 Unspecified lump in breast: Secondary | ICD-10-CM

## 2015-08-27 DIAGNOSIS — C801 Malignant (primary) neoplasm, unspecified: Secondary | ICD-10-CM

## 2015-08-27 HISTORY — PX: BREAST BIOPSY: SHX20

## 2015-08-27 HISTORY — DX: Malignant (primary) neoplasm, unspecified: C80.1

## 2015-08-27 NOTE — Progress Notes (Signed)
Patient ID: Anne Jennings, female   DOB: 08-21-1956, 59 y.o.   MRN: [id number]  Chief Complaint  Patient presents with  . Other    Cat 5 Mammogram     HPI [name] is a 59 y.o. female here who presents for a breast evaluation. The most recent mammogram was done on 08/18/15.  Patient does perform regular self breast checks and gets regular mammograms done.   Recently she happened to feel a mass in the right breast in the outer aspect and subsequently had a mammogram and ultrasound.  HPI  Past Medical History  Diagnosis Date  . Hypertension   . Hyperlipidemia   . Chronic kidney disease   . CHF (congestive heart failure) ([place])   . Enlarged heart   . Bronchitis     Past Surgical History  Procedure Laterality Date  . Total knee arthroplasty    . Hernia repair    . Arm surgery    . Tubal ligation      Family History  Problem Relation Age of Onset  . Cancer Mother   . Hypertension Father   . Heart attack Father   . Stroke Father     Social History Social History  Substance Use Topics  . Smoking status: Never Smoker   . Smokeless tobacco: None  . Alcohol Use: No    No Known Allergies  Current Outpatient Prescriptions  Medication Sig Dispense Refill  . aspirin EC 81 MG tablet Take 81 mg by mouth daily.     . chlorthalidone (HYGROTON) 25 MG tablet Take 12.5 mg by mouth daily.    Marland Kitchen dexlansoprazole (DEXILANT) 60 MG capsule Take 60 mg by mouth daily.    . ergocalciferol (VITAMIN D2) 50000 UNITS capsule Take 50,000 Units by mouth once a week.    . losartan (COZAAR) 100 MG tablet Take 100 mg by mouth daily.    . metoprolol succinate (TOPROL-XL) 50 MG 24 hr tablet Take 50 mg by mouth daily. Take with or immediately following a meal.    . naproxen (NAPROSYN) 250 MG tablet Take by mouth as needed.     No current facility-administered medications for this visit.    Review of Systems Review of Systems  Constitutional: Negative.   Respiratory: Negative.    Cardiovascular: Negative.     Blood pressure 120/70, pulse 76, resp. rate 14, height 5\' 3"  (1.6 m), weight 316 lb (143.337 kg).  Physical Exam Physical Exam  Constitutional: She is oriented to person, place, and time. She appears well-developed and well-nourished.  Eyes: Conjunctivae are normal. No scleral icterus.  Neck: Neck supple.  Cardiovascular: Normal rate and normal heart sounds.   Pulmonary/Chest: Effort normal and breath sounds normal. Right breast exhibits mass ( 9 o'clock hard mass) and skin change. Right breast exhibits no inverted nipple, no nipple discharge and no tenderness. Left breast exhibits no inverted nipple, no mass, no nipple discharge, no skin change and no tenderness.    Lymphadenopathy:    She has no cervical adenopathy.    She has no axillary adenopathy.  Neurological: She is alert and oriented to person, place, and time.  Skin: Skin is warm and dry.    Data Reviewed Mammogram, [place]  Assessment    Highly suspicious large area of masses and microcalcifications, skin changes     Plan    Core biopsy with clip placement completed with informed pt consent. Await path report.       PCP: Dr. Rosario Jacks  SANKAR,SEEPLAPUTHUR G 08/27/2015, 6:23 PM

## 2015-08-27 NOTE — Patient Instructions (Signed)

## 2015-08-28 ENCOUNTER — Other Ambulatory Visit: Payer: Self-pay

## 2015-08-28 DIAGNOSIS — C50911 Malignant neoplasm of unspecified site of right female breast: Secondary | ICD-10-CM

## 2015-09-01 ENCOUNTER — Ambulatory Visit (INDEPENDENT_AMBULATORY_CARE_PROVIDER_SITE_OTHER): Payer: BLUE CROSS/BLUE SHIELD | Admitting: General Surgery

## 2015-09-01 ENCOUNTER — Telehealth: Payer: Self-pay

## 2015-09-01 ENCOUNTER — Other Ambulatory Visit: Payer: Self-pay | Admitting: General Surgery

## 2015-09-01 ENCOUNTER — Encounter: Payer: Self-pay | Admitting: General Surgery

## 2015-09-01 VITALS — BP 144/84 | HR 88 | Resp 14 | Ht 63.0 in | Wt 316.0 lb

## 2015-09-01 DIAGNOSIS — C50411 Malignant neoplasm of upper-outer quadrant of right female breast: Secondary | ICD-10-CM | POA: Diagnosis not present

## 2015-09-01 LAB — CANCER ANTIGEN 27.29: CA 27.29: 35.9 U/mL (ref 0.0–38.6)

## 2015-09-01 LAB — COMPREHENSIVE METABOLIC PANEL
ALBUMIN: 4.1 g/dL (ref 3.6–4.8)
ALT: 7 IU/L (ref 0–32)
AST: 9 IU/L (ref 0–40)
Albumin/Globulin Ratio: 1.4 (ref 1.1–2.5)
Alkaline Phosphatase: 82 IU/L (ref 39–117)
BUN / CREAT RATIO: 16 (ref 11–26)
BUN: 43 mg/dL — AB (ref 8–27)
Bilirubin Total: 0.3 mg/dL (ref 0.0–1.2)
CALCIUM: 9.6 mg/dL (ref 8.7–10.3)
CHLORIDE: 98 mmol/L (ref 97–106)
CO2: 21 mmol/L (ref 18–29)
CREATININE: 2.63 mg/dL — AB (ref 0.57–1.00)
GFR calc Af Amer: 22 mL/min/{1.73_m2} — ABNORMAL LOW (ref >59)
GFR calc non Af Amer: 19 mL/min/{1.73_m2} — ABNORMAL LOW (ref >59)
GLOBULIN, TOTAL: 2.9 g/dL (ref 1.5–4.5)
GLUCOSE: 108 mg/dL — AB (ref 65–99)
Potassium: 4.4 mmol/L (ref 3.5–5.2)
SODIUM: 138 mmol/L (ref 136–144)
Total Protein: 7 g/dL (ref 6.0–8.5)

## 2015-09-01 LAB — CBC WITH DIFFERENTIAL/PLATELET
Basophils Absolute: 0 10*3/uL (ref 0.0–0.2)
Basos: 0 %
EOS (ABSOLUTE): 0.2 10*3/uL (ref 0.0–0.4)
EOS: 3 %
HEMATOCRIT: 37.8 % (ref 34.0–46.6)
HEMOGLOBIN: 12.2 g/dL (ref 11.1–15.9)
IMMATURE GRANS (ABS): 0 10*3/uL (ref 0.0–0.1)
IMMATURE GRANULOCYTES: 0 %
LYMPHS: 36 %
Lymphocytes Absolute: 2.4 10*3/uL (ref 0.7–3.1)
MCH: 22.1 pg — ABNORMAL LOW (ref 26.6–33.0)
MCHC: 32.3 g/dL (ref 31.5–35.7)
MCV: 69 fL — ABNORMAL LOW (ref 79–97)
MONOCYTES: 6 %
Monocytes Absolute: 0.4 10*3/uL (ref 0.1–0.9)
NEUTROS PCT: 55 %
Neutrophils Absolute: 3.6 10*3/uL (ref 1.4–7.0)
Platelets: 232 10*3/uL (ref 150–379)
RBC: 5.51 x10E6/uL — AB (ref 3.77–5.28)
RDW: 17.6 % — ABNORMAL HIGH (ref 12.3–15.4)
WBC: 6.6 10*3/uL (ref 3.4–10.8)

## 2015-09-01 NOTE — Telephone Encounter (Signed)
Patient is scheduled to see Dr Oliva Bustard on 09/08/15 at 1:00 pm. She is aware of date and time.

## 2015-09-01 NOTE — Patient Instructions (Addendum)
The patient is aware to call back for any questions or concerns.  We will call you with the appointment to see Dr Oliva Bustard at the [facility], their number is [phone]  You are scheduled for a PET scan at [facility] on Friday, 09/04/15 at 8:30 am. You are to arrive by 8:00 am at the [facility] Registration desk. You are to have nothing to eat or drink 6 hours prior to this scan.

## 2015-09-01 NOTE — Progress Notes (Signed)
Here today for breast discussion. Steri strips in place. Area clean no bruising. Pt was advised on path report-invasive mammary CA, and DCIS- ER/PR and her 2 pending. Clinical stage at least T2 N1 based on imaging. Skin change needs biopsy. Given advanced stage PET scan has been requested and oncology consult arranged. Full discussion with pt and her family and they are agreeable to the plan. Labs showed elevated Creat-pt aware and being followed by nephrology.  Skin biopsy performed today over the involved skin. 48ml 1% xylocaine used. Punch biopsy cpmpleted and open area cauterized. Dressed with neosporin, gauze and tegaderm.   Patient is scheduled for a PET scan at [facility] on 09/04/15 at 8:30 am. She is to arrive by 8:00 am at the [facility] Registration desk. She is to have nothing to eat or drink 6 hours prior. Patient is aware of date, time, and instructions.

## 2015-09-02 ENCOUNTER — Telehealth: Payer: Self-pay | Admitting: *Deleted

## 2015-09-02 NOTE — Telephone Encounter (Signed)
Notified patient as instructed, patient pleased. Discussed follow-up appointments, patient agrees  

## 2015-09-02 NOTE — Telephone Encounter (Signed)
Call from pathology, recent breast punch biopsy showed SPARSE DERMATITIS WITH TELANGECTASIA.

## 2015-09-04 ENCOUNTER — Ambulatory Visit
Admission: RE | Admit: 2015-09-04 | Discharge: 2015-09-04 | Disposition: A | Discharge status: Home / Self Care | Payer: BLUE CROSS/BLUE SHIELD | Source: Ambulatory Visit | Attending: General Surgery | Admitting: General Surgery

## 2015-09-04 DIAGNOSIS — I517 Cardiomegaly: Secondary | ICD-10-CM | POA: Insufficient documentation

## 2015-09-04 DIAGNOSIS — N261 Atrophy of kidney (terminal): Secondary | ICD-10-CM | POA: Diagnosis not present

## 2015-09-04 DIAGNOSIS — C50411 Malignant neoplasm of upper-outer quadrant of right female breast: Secondary | ICD-10-CM | POA: Insufficient documentation

## 2015-09-04 DIAGNOSIS — I7789 Other specified disorders of arteries and arterioles: Secondary | ICD-10-CM | POA: Diagnosis not present

## 2015-09-04 DIAGNOSIS — I7090 Unspecified atherosclerosis: Secondary | ICD-10-CM | POA: Diagnosis not present

## 2015-09-04 DIAGNOSIS — C773 Secondary and unspecified malignant neoplasm of axilla and upper limb lymph nodes: Secondary | ICD-10-CM | POA: Diagnosis not present

## 2015-09-04 DIAGNOSIS — I289 Disease of pulmonary vessels, unspecified: Secondary | ICD-10-CM | POA: Diagnosis not present

## 2015-09-04 LAB — GLUCOSE, CAPILLARY: GLUCOSE-CAPILLARY: 76 mg/dL (ref 65–99)

## 2015-09-04 MED ORDER — FLUDEOXYGLUCOSE F - 18 (FDG) INJECTION
11.4800 | Freq: Once | INTRAVENOUS | Status: DC | PRN
  Administered 2015-09-04: 11.48 via INTRAVENOUS
  Filled 2015-09-04: qty 11.48

## 2015-09-07 ENCOUNTER — Encounter: Payer: Self-pay | Admitting: *Deleted

## 2015-09-07 NOTE — Progress Notes (Signed)
Oncology Nurse Navigator Documentation  Oncology Nurse Navigator Flowsheets 09/07/2015  Referral date to RadOnc/MedOnc 09/08/2015  Navigator Encounter Type Introductory phone call  Patient Visit Type Medonc  Time Spent with Patient 15   Called patient to establish navigation services.  States her appointment with [place] should have been cancelled.  States she is going to [employer].  Encouraged her to call me back if she changes her mind and would like an appointment.

## 2015-09-08 ENCOUNTER — Inpatient Hospital Stay: Payer: BLUE CROSS/BLUE SHIELD | Admitting: Oncology

## 2015-09-11 ENCOUNTER — Encounter: Payer: Self-pay | Admitting: General Surgery

## 2015-09-11 ENCOUNTER — Ambulatory Visit (INDEPENDENT_AMBULATORY_CARE_PROVIDER_SITE_OTHER): Payer: BLUE CROSS/BLUE SHIELD | Admitting: General Surgery

## 2015-09-11 VITALS — BP 130/78 | HR 76 | Resp 12 | Ht 63.0 in | Wt 319.0 lb

## 2015-09-11 DIAGNOSIS — C50411 Malignant neoplasm of upper-outer quadrant of right female breast: Secondary | ICD-10-CM

## 2015-09-11 NOTE — Progress Notes (Signed)
This is a 59 year old female here today to discuss breast cancer.   Patient has an appt with [facility] next week. She is not sure if she is going to get her treatment there or come back here. Explained all the findings to date -she has at least a stage II b, possible IIIa cancer. As such she will need chemo first followed by local surgical treatment.  All pertinent copies were given to her to take to her appt next week. If she chooses to come back here for treatment have asker to call here.   Cc: PCP

## 2015-09-15 DIAGNOSIS — D509 Iron deficiency anemia, unspecified: Secondary | ICD-10-CM | POA: Insufficient documentation

## 2015-09-15 DIAGNOSIS — C50919 Malignant neoplasm of unspecified site of unspecified female breast: Secondary | ICD-10-CM | POA: Insufficient documentation

## 2015-09-15 DIAGNOSIS — E669 Obesity, unspecified: Secondary | ICD-10-CM | POA: Insufficient documentation

## 2015-09-28 ENCOUNTER — Telehealth: Payer: Self-pay | Admitting: General Surgery

## 2015-09-28 NOTE — Telephone Encounter (Signed)
Called patient to check on her status. She had her consultation at [facility] and subsequently had a port placed. Due to start neoadjuvant chemo and will  have surgery thereafter. She will continue and complete her treatment at [facility].

## 2015-10-14 DIAGNOSIS — N183 Chronic kidney disease, stage 3 unspecified: Secondary | ICD-10-CM | POA: Insufficient documentation

## 2016-01-13 DIAGNOSIS — Z9189 Other specified personal risk factors, not elsewhere classified: Secondary | ICD-10-CM | POA: Insufficient documentation

## 2016-03-29 DIAGNOSIS — T8130XD Disruption of wound, unspecified, subsequent encounter: Secondary | ICD-10-CM | POA: Insufficient documentation

## 2016-11-15 DIAGNOSIS — G620 Drug-induced polyneuropathy: Secondary | ICD-10-CM | POA: Insufficient documentation

## 2017-02-28 DIAGNOSIS — M1711 Unilateral primary osteoarthritis, right knee: Secondary | ICD-10-CM | POA: Insufficient documentation

## 2017-08-29 LAB — HM MAMMOGRAPHY

## 2018-03-29 ENCOUNTER — Encounter: Payer: Self-pay | Admitting: Physician Assistant

## 2018-03-29 ENCOUNTER — Ambulatory Visit: Payer: BLUE CROSS/BLUE SHIELD | Admitting: Physician Assistant

## 2018-03-29 VITALS — BP 136/88 | HR 88 | Temp 97.7°F | Resp 16 | Ht 63.0 in | Wt 311.0 lb

## 2018-03-29 DIAGNOSIS — N183 Chronic kidney disease, stage 3 unspecified: Secondary | ICD-10-CM

## 2018-03-29 DIAGNOSIS — M545 Low back pain, unspecified: Secondary | ICD-10-CM

## 2018-03-29 DIAGNOSIS — D649 Anemia, unspecified: Secondary | ICD-10-CM | POA: Insufficient documentation

## 2018-03-29 DIAGNOSIS — Z13220 Encounter for screening for lipoid disorders: Secondary | ICD-10-CM

## 2018-03-29 DIAGNOSIS — Z1159 Encounter for screening for other viral diseases: Secondary | ICD-10-CM | POA: Diagnosis not present

## 2018-03-29 DIAGNOSIS — C50911 Malignant neoplasm of unspecified site of right female breast: Secondary | ICD-10-CM | POA: Diagnosis not present

## 2018-03-29 DIAGNOSIS — M5136 Other intervertebral disc degeneration, lumbar region: Secondary | ICD-10-CM | POA: Diagnosis not present

## 2018-03-29 DIAGNOSIS — Z114 Encounter for screening for human immunodeficiency virus [HIV]: Secondary | ICD-10-CM

## 2018-03-29 DIAGNOSIS — I10 Essential (primary) hypertension: Secondary | ICD-10-CM

## 2018-03-29 DIAGNOSIS — G8929 Other chronic pain: Secondary | ICD-10-CM | POA: Diagnosis not present

## 2018-03-29 DIAGNOSIS — J45909 Unspecified asthma, uncomplicated: Secondary | ICD-10-CM | POA: Diagnosis not present

## 2018-03-29 NOTE — Progress Notes (Signed)
Patient: Anne Jennings Female    DOB: [birth year]   62 y.o.   MRN: [id number] Visit Date: 03/30/2018  Today's Provider: Trinna Post, PA-C   Chief Complaint  Patient presents with  . Establish Care   Subjective:    HPI  Anne Jennings is a 62 y/o woman who presents today to establish care. She used to see. Dr. Casilda Carls but her insurance changed. She has three children, ages 14, 63, 46 - living in [place], [place], and [place]. She worked at [employer] for 22 years but is currently not working, on disability.    Breat Cancer: Inflammatory carcinoma of right breast, ER+/PR+/HER2-  She has a history of inflammatory breast cancer in her right breast diagnosed 09/15/2015. She underwent right radical mastectomy, radiation therapy, and is currently on Femara. She is followed by [facility].   Microcytic Anemia  Chronic issue, followed by [facility].   HTN  Currently managed by cardiology, Verlee Rossetti PA-C and Dr. Lora Paula. She is currently on Carvedilol 12.5 mg BID, Losartan 50 mg daily, and chlorthalidone 25 mg daily.   CKD III   Followed by Tawni Levy at [facility]. Most recent Scr on 02/27/2018 was 1.5, GFR ~ 42.   Back Pain  This is a chronic issue, patient reports bilateral back pain that inhibits movement, has been going on for years. Lumbar Spine XR from 09/2017 shows L4-L5 and L5-S1 grade 1 anterolisthesis, moderate multilevel lumbar DDD with disc height loss and endplate remodeling. Lower lumbar facet osteoarthopathy worst at L4-L5 and sclerosis of SI joints. Patient is unable to take NSAIDs due to CKD and so has been taking Tylenol, asks if there is something also to take today. Reports she has lost 9 lbs with diet modifications. She sees orthopedics but for right knee pain only.  Maintenance  Unaware of last PAP smear, possibly 2016. Does not recall tetanus shot in the past 10 years.     Allergies  Allergen Reactions  . Tape Other (See  Comments)    Blistering and desquamation     Current Outpatient Medications:  .  albuterol (PROAIR HFA) 108 (90 Base) MCG/ACT inhaler, Inhale into the lungs., Disp: , Rfl:  .  aspirin EC 81 MG tablet, Take 81 mg by mouth daily. , Disp: , Rfl:  .  calcium-vitamin D (OSCAL WITH D) 500-200 MG-UNIT TABS tablet, Take by mouth., Disp: , Rfl:  .  carvedilol (COREG) 12.5 MG tablet, , Disp: , Rfl: 3 .  chlorthalidone (HYGROTON) 25 MG tablet, Take 12.5 mg by mouth daily., Disp: , Rfl:  .  dexlansoprazole (DEXILANT) 60 MG capsule, Take 60 mg by mouth daily., Disp: , Rfl:  .  ergocalciferol (VITAMIN D2) 50000 UNITS capsule, Take 50,000 Units by mouth once a week., Disp: , Rfl:  .  gabapentin (NEURONTIN) 300 MG capsule, Take by mouth., Disp: , Rfl:  .  letrozole (FEMARA) 2.5 MG tablet, Take by mouth., Disp: , Rfl:  .  losartan (COZAAR) 100 MG tablet, Take 100 mg by mouth daily., Disp: , Rfl:   Review of Systems  Constitutional: Positive for fatigue. Negative for activity change, appetite change, chills, diaphoresis, fever and unexpected weight change.  HENT: Positive for postnasal drip and sinus pressure.   Eyes: Negative.   Respiratory: Negative.   Cardiovascular: Negative.   Gastrointestinal: Negative.   Endocrine: Negative.   Genitourinary: Negative.   Musculoskeletal: Positive for arthralgias, back pain and myalgias.  Skin: Negative.  Allergic/Immunologic: Negative.   Neurological: Positive for light-headedness and numbness. Negative for dizziness, tremors, seizures, syncope, facial asymmetry, speech difficulty, weakness and headaches.  Hematological: Negative.   Psychiatric/Behavioral: Negative.    Family History  Problem Relation Age of Onset  . Cancer Mother   . Colon cancer Mother   . Arthritis Mother   . Hypertension Father   . Heart attack Father   . Stroke Father   . Heart disease Sister   . Cancer Brother   . Parkinson's disease Son   . Diabetes Sister    Past Surgical  History:  Procedure Laterality Date  . arm surgery    . BREAST BIOPSY Right 08-27-15   INVASIVE AND IN SITU MAMMARY CARCINOMA WITH CALCIFICATION AND NECROSIS  . HERNIA REPAIR    . TOTAL KNEE ARTHROPLASTY    . TUBAL LIGATION       Social History   Tobacco Use  . Smoking status: Never Smoker  . Smokeless tobacco: Never Used  Substance Use Topics  . Alcohol use: No   Objective:   BP 136/88 (BP Location: Left Wrist, Patient Position: Sitting, Cuff Size: Normal)   Pulse 88   Temp 97.7 F (36.5 C) (Oral)   Resp 16   Ht 5' 3"  (1.6 m)   Wt (!) 311 lb (141.1 kg)   SpO2 98%   BMI 55.09 kg/m  Vitals:   03/29/18 1013  BP: 136/88  Pulse: 88  Resp: 16  Temp: 97.7 F (36.5 C)  TempSrc: Oral  SpO2: 98%  Weight: (!) 311 lb (141.1 kg)  Height: 5' 3"  (1.6 m)     Physical Exam  Constitutional: She is oriented to person, place, and time. She appears well-developed and well-nourished.  HENT:  Head: Normocephalic and atraumatic.  Right Ear: Tympanic membrane and external ear normal.  Left Ear: Tympanic membrane and external ear normal.  Mouth/Throat: Oropharynx is clear and moist. No oropharyngeal exudate.  Eyes: Conjunctivae are normal. Right eye exhibits no discharge. Left eye exhibits no discharge.  Neck: Neck supple. No thyromegaly present.  Cardiovascular: Normal rate and regular rhythm.  Pulmonary/Chest: Effort normal and breath sounds normal.  Abdominal: Soft. Bowel sounds are normal.  Lymphadenopathy:    She has no cervical adenopathy.  Neurological: She is alert and oriented to person, place, and time.  Skin: Skin is warm and dry.  Psychiatric: She has a normal mood and affect. Her behavior is normal.        Assessment & Plan:     1. Inflammatory breast cancer, right ([place])  Followed by [facility], currently on Femara.  2. Chronic kidney disease, stage III (moderate) ([place])  Followed by [facility]. Avoid NSAIDs.  3. Essential hypertension  Followed  by Cardiology.  4. Chronic bilateral low back pain without sciatica  Patient has evidence of severe DDD on 2018 lumbar xray in [place]. She has lost 9 lbs with dietary modification, congratulated her on that. Today she is requesting pain medication. Unfortunately, her CKD precludes the use of NSAIDs. I do not think prescribing narcotics to a patient with chronic back pain on my first visit meeting them without any other failed interventions documented is appropriate, and I have communicated this to her. I offered her a referral to pain management or orthopedic surgery for further evaluations and intervention, but she does not appear interested in this. She says she will ask her nephrology provider if there are other options for pain management.    5. DDD (degenerative  disc disease), lumbar   6. Encounter for screening for HIV  - HIV antibody (with reflex)  7. Encounter for hepatitis C screening test for low risk patient  - Hepatitis C antibody  8. Lipid screening  - Lipid Profile  9. Uncomplicated asthma, unspecified asthma severity, unspecified whether persistent  Return in about 2 months (around 05/29/2018) for PAP CPE.  The entirety of the information documented in the History of Present Illness, Review of Systems and Physical Exam were personally obtained by me. Portions of this information were initially documented by Ashley Royalty, CMA and reviewed by me for thoroughness and accuracy.   I have spent 45 minutes with this patient, >50% of which was spent on counseling and coordination of care.       Trinna Post, PA-C  [facility]

## 2018-03-30 DIAGNOSIS — G8929 Other chronic pain: Secondary | ICD-10-CM | POA: Insufficient documentation

## 2018-03-30 DIAGNOSIS — C50911 Malignant neoplasm of unspecified site of right female breast: Secondary | ICD-10-CM | POA: Insufficient documentation

## 2018-03-30 DIAGNOSIS — J45909 Unspecified asthma, uncomplicated: Secondary | ICD-10-CM | POA: Insufficient documentation

## 2018-03-30 DIAGNOSIS — M5136 Other intervertebral disc degeneration, lumbar region: Secondary | ICD-10-CM | POA: Insufficient documentation

## 2018-03-30 DIAGNOSIS — M549 Dorsalgia, unspecified: Secondary | ICD-10-CM

## 2018-05-30 ENCOUNTER — Ambulatory Visit (INDEPENDENT_AMBULATORY_CARE_PROVIDER_SITE_OTHER): Payer: BLUE CROSS/BLUE SHIELD | Admitting: Physician Assistant

## 2018-05-30 ENCOUNTER — Encounter: Payer: Self-pay | Admitting: Physician Assistant

## 2018-05-30 VITALS — BP 110/82 | HR 75 | Temp 98.4°F | Resp 16 | Wt 302.6 lb

## 2018-05-30 DIAGNOSIS — Z1211 Encounter for screening for malignant neoplasm of colon: Secondary | ICD-10-CM | POA: Diagnosis not present

## 2018-05-30 DIAGNOSIS — G8929 Other chronic pain: Secondary | ICD-10-CM

## 2018-05-30 DIAGNOSIS — Z0000 Encounter for general adult medical examination without abnormal findings: Secondary | ICD-10-CM

## 2018-05-30 DIAGNOSIS — M25561 Pain in right knee: Secondary | ICD-10-CM

## 2018-05-30 DIAGNOSIS — Z1159 Encounter for screening for other viral diseases: Secondary | ICD-10-CM | POA: Diagnosis not present

## 2018-05-30 DIAGNOSIS — I10 Essential (primary) hypertension: Secondary | ICD-10-CM | POA: Diagnosis not present

## 2018-05-30 DIAGNOSIS — Z114 Encounter for screening for human immunodeficiency virus [HIV]: Secondary | ICD-10-CM | POA: Diagnosis not present

## 2018-05-30 DIAGNOSIS — Z124 Encounter for screening for malignant neoplasm of cervix: Secondary | ICD-10-CM | POA: Diagnosis not present

## 2018-05-30 NOTE — Patient Instructions (Signed)

## 2018-05-30 NOTE — Progress Notes (Signed)
Patient: Anne Jennings, Female    DOB: 22-May-1956, 62 y.o.   MRN: [id number] Visit Date: 05/31/2018  Today's Provider: Trinna Post, PA-C   Chief Complaint  Patient presents with  . Annual Exam   Subjective:    Annual physical exam Anne Jennings is a 62 y.o. female who presents today for health maintenance and complete physical. She feels fairly well. She reports she is not actively exercising. She reports she is sleeping well. Patients last reported Tdap 06/17/15 and Mammogram 08/29/17. Patient is due today for screening labs, pap smear, and colonoscopy screening  Mother with colon cancer, needs colonoscopy. History of inflammatory breast cancer, breast exams and mammography and is followed by [facility].   Has CKD and previously saw cardiology for HTN, now sees nephrology. She has fallen in the past several days after feeling light headed, difficult for her to get up due to OA in her knees. She reports being unable to take her blood pressure at that time. She has no history of diabetes. She does not have a life alert, but her son did make her get an apple watch which she can make calls from in case she falls down and is unable to get up.  Continues with OA and pain in right knee and back. Left knee with history of TKR. Right knee she has had previous steroid injection with orthopedist, next step was to be Synvysc, though her insurance changed and she is trying to find covered providers. She has lost 9 pounds since last being here, intentionally.  -----------------------------------------------------------------   Review of Systems  Constitutional: Positive for fatigue.  HENT: Negative.   Eyes: Negative.   Respiratory: Positive for shortness of breath.   Cardiovascular: Negative.   Gastrointestinal: Negative.   Endocrine: Negative.   Genitourinary: Negative.   Musculoskeletal: Positive for back pain.  Allergic/Immunologic: Negative.   Neurological: Positive for  light-headedness and numbness.  Hematological: Negative.   Psychiatric/Behavioral: Negative.     Social History She  reports that she has never smoked. She has never used smokeless tobacco. She reports that she does not drink alcohol or use drugs. Social History   Socioeconomic History  . Marital status: Divorced    Spouse name: Not on file  . Number of children: Not on file  . Years of education: Not on file  . Highest education level: Not on file  Occupational History  . Not on file  Social Needs  . Financial resource strain: Not on file  . Food insecurity:    Worry: Not on file    Inability: Not on file  . Transportation needs:    Medical: Not on file    Non-medical: Not on file  Tobacco Use  . Smoking status: Never Smoker  . Smokeless tobacco: Never Used  Substance and Sexual Activity  . Alcohol use: No  . Drug use: No  . Sexual activity: Not on file  Lifestyle  . Physical activity:    Days per week: Not on file    Minutes per session: Not on file  . Stress: Not on file  Relationships  . Social connections:    Talks on phone: Not on file    Gets together: Not on file    Attends religious service: Not on file    Active member of club or organization: Not on file    Attends meetings of clubs or organizations: Not on file    Relationship status: Not on  file  Other Topics Concern  . Not on file  Social History Narrative  . Not on file    Patient Active Problem List   Diagnosis Date Noted  . Inflammatory breast cancer, right ([place]) 03/30/2018  . Chronic back pain 03/30/2018  . DDD (degenerative disc disease), lumbar 03/30/2018  . Asthma 03/30/2018  . Anemia 03/29/2018  . Primary osteoarthritis of right knee 02/28/2017  . Drug-induced peripheral neuropathy ([place]) 11/15/2016  . Disruption of wound, subsequent encounter 03/29/2016  . Other specified personal risk factors, not elsewhere classified 01/13/2016  . Chronic kidney disease, stage III (moderate) ([place])  10/14/2015  . Microcytic anemia 09/15/2015  . Obesity 09/15/2015  . Other chest pain 09/22/2014  . Essential hypertension 09/22/2014    Past Surgical History:  Procedure Laterality Date  . arm surgery    . BREAST BIOPSY Right 08-27-15   INVASIVE AND IN SITU MAMMARY CARCINOMA WITH CALCIFICATION AND NECROSIS  . HERNIA REPAIR    . TOTAL KNEE ARTHROPLASTY    . TUBAL LIGATION      Family History  Family Status  Relation Name Status  . Mother  Deceased at age 74's  . Father  Deceased  . Sister  Deceased  . Brother  Deceased  . Son  Alive  . Sister  Deceased   Her family history includes Arthritis in her mother; Cancer in her brother and mother; Colon cancer in her mother; Diabetes in her sister; Heart attack in her father; Heart disease in her sister; Hypertension in her father; Parkinson's disease in her son; Stroke in her father.     Allergies  Allergen Reactions  . Tape Other (See Comments)    Blistering and desquamation    Previous Medications   ALBUTEROL (PROAIR HFA) 108 (90 BASE) MCG/ACT INHALER    Inhale into the lungs.   ASPIRIN EC 81 MG TABLET    Take 81 mg by mouth daily.    CALCIUM-VITAMIN D (OSCAL WITH D) 500-200 MG-UNIT TABS TABLET    Take by mouth.   CARVEDILOL (COREG) 12.5 MG TABLET       CHLORTHALIDONE (HYGROTON) 25 MG TABLET    Take 12.5 mg by mouth daily.   DEXLANSOPRAZOLE (DEXILANT) 60 MG CAPSULE    Take 60 mg by mouth daily.   ERGOCALCIFEROL (VITAMIN D2) 50000 UNITS CAPSULE    Take 50,000 Units by mouth once a week.   GABAPENTIN (NEURONTIN) 300 MG CAPSULE    Take by mouth.   LETROZOLE (FEMARA) 2.5 MG TABLET    Take by mouth.   LOSARTAN (COZAAR) 100 MG TABLET    Take 100 mg by mouth daily.    Patient Care Team: Paulene Floor as PCP - General (Physician Assistant) Casilda Carls, MD (Inactive) as Consulting Physician (Internal Medicine) Christene Lye, MD (General Surgery)      Objective:   Vitals: BP 110/82   Pulse 75   Temp  98.4 F (36.9 C) (Oral)   Resp 16   Wt (!) 302 lb 9.6 oz (137.3 kg)   SpO2 98%   BMI 53.60 kg/m    Physical Exam  Constitutional: She is oriented to person, place, and time. She appears well-developed and well-nourished.  Cardiovascular: Normal rate and regular rhythm.  Pulmonary/Chest: Effort normal and breath sounds normal.  Genitourinary: Uterus normal. No labial fusion. There is no rash, tenderness, lesion or injury on the right labia. There is no rash, tenderness, lesion or injury on the left labia. Cervix exhibits no motion  tenderness, no discharge and no friability. Right adnexum displays no mass, no tenderness and no fullness. Left adnexum displays no mass, no tenderness and no fullness. No erythema, tenderness or bleeding in the vagina. No foreign body in the vagina. No signs of injury around the vagina. No vaginal discharge found.  Neurological: She is alert and oriented to person, place, and time.  Skin: Skin is warm and dry.  Psychiatric: She has a normal mood and affect. Her behavior is normal.     Depression Screen PHQ 2/9 Scores 05/30/2018 04/06/2018  PHQ - 2 Score 2 2  PHQ- 9 Score 3 10      Assessment & Plan:     Routine Health Maintenance and Physical Exam  Exercise Activities and Dietary recommendations Goals    None      Immunization History  Administered Date(s) Administered  . Influenza,inj,Quad PF,6+ Mos 09/30/2016, 12/19/2017  . Influenza-Unspecified 07/30/2015    Health Maintenance  Topic Date Due  . Hepatitis C Screening  1956/06/27  . HIV Screening  01/06/1969  . TETANUS/TDAP  01/06/1973  . PAP SMEAR  01/06/1975  . COLONOSCOPY  01/07/2004  . INFLUENZA VACCINE  06/14/2018  . MAMMOGRAM  08/30/2019     Discussed health benefits of physical activity, and encouraged her to engage in regular exercise appropriate for her age and condition.  1. Annual physical exam   2. Essential hypertension  Sees Nephrology. If she wishes me to refill  her medications she should come back in 6 months. She does not seem to know what medications she is taking and she should bring her medicine bag with her.   3. Screening for colon cancer  - Ambulatory referral to Gastroenterology  4. Cervical cancer screening  - Pap IG and HPV (high risk) DNA detection  5. Encounter for screening for HIV  - HIV antibody (with reflex)  6. Need for hepatitis C screening test  - Hepatitis c antibody (reflex)  7. Chronic pain of right knee  - Ambulatory referral to Orthopedics  Return in about 6 months (around 11/30/2018) for HTN.  The entirety of the information documented in the History of Present Illness, Review of Systems and Physical Exam were personally obtained by me. Portions of this information were initially documented by Jennings Books, CMA and reviewed by me for thoroughness and accuracy.      --------------------------------------------------------------------

## 2018-05-31 ENCOUNTER — Other Ambulatory Visit: Payer: Self-pay

## 2018-05-31 DIAGNOSIS — Z800 Family history of malignant neoplasm of digestive organs: Secondary | ICD-10-CM

## 2018-05-31 DIAGNOSIS — Z1211 Encounter for screening for malignant neoplasm of colon: Secondary | ICD-10-CM

## 2018-06-01 LAB — PAP IG AND HPV HIGH-RISK
HPV, high-risk: NEGATIVE
PAP Smear Comment: 0

## 2018-06-04 ENCOUNTER — Other Ambulatory Visit: Payer: Self-pay

## 2018-06-04 ENCOUNTER — Telehealth: Payer: Self-pay

## 2018-06-04 NOTE — Telephone Encounter (Signed)
-----   Message from Trinna Post, [place] sent at 06/04/2018  4:02 PM EDT ----- PAP smear is normal and HPV is negative.

## 2018-06-04 NOTE — Telephone Encounter (Signed)
Pt advised.   Thanks,   -Bacilio Abascal  

## 2018-07-03 ENCOUNTER — Telehealth: Payer: Self-pay

## 2018-07-03 ENCOUNTER — Encounter: Payer: Self-pay | Admitting: *Deleted

## 2018-07-03 NOTE — Telephone Encounter (Signed)
Received notification from [facility] to cancel patients procedure due to her decease.  Colonoscopy has been canceled with Robyn in Endoscopy.  Thanks [employer]

## 2018-07-09 ENCOUNTER — Ambulatory Visit: Admit: 2018-07-09 | Payer: BLUE CROSS/BLUE SHIELD | Admitting: Gastroenterology

## 2018-07-09 SURGERY — COLONOSCOPY WITH PROPOFOL
Anesthesia: General

## 2018-07-15 DEATH — deceased

## 2018-11-30 ENCOUNTER — Ambulatory Visit: Payer: Self-pay | Admitting: Physician Assistant
